# Patient Record
Sex: Male | Born: 1950 | Race: White | Hispanic: No | Marital: Married | Smoking: Former smoker
Health system: Southern US, Community
[De-identification: ages and names within clinical notes are randomized; demographics above are authoritative.]

## PROBLEM LIST (undated history)

## (undated) DIAGNOSIS — M1990 Unspecified osteoarthritis, unspecified site: Secondary | ICD-10-CM

## (undated) DIAGNOSIS — K760 Fatty (change of) liver, not elsewhere classified: Secondary | ICD-10-CM

## (undated) DIAGNOSIS — D3502 Benign neoplasm of left adrenal gland: Secondary | ICD-10-CM

## (undated) DIAGNOSIS — J439 Emphysema, unspecified: Secondary | ICD-10-CM

## (undated) DIAGNOSIS — K219 Gastro-esophageal reflux disease without esophagitis: Secondary | ICD-10-CM

## (undated) DIAGNOSIS — E119 Type 2 diabetes mellitus without complications: Secondary | ICD-10-CM

## (undated) DIAGNOSIS — I700 Atherosclerosis of aorta: Secondary | ICD-10-CM

## (undated) DIAGNOSIS — I499 Cardiac arrhythmia, unspecified: Secondary | ICD-10-CM

## (undated) DIAGNOSIS — I714 Abdominal aortic aneurysm, without rupture, unspecified: Secondary | ICD-10-CM

## (undated) DIAGNOSIS — Z87891 Personal history of nicotine dependence: Secondary | ICD-10-CM

## (undated) DIAGNOSIS — I10 Essential (primary) hypertension: Secondary | ICD-10-CM

## (undated) DIAGNOSIS — M170 Bilateral primary osteoarthritis of knee: Secondary | ICD-10-CM

## (undated) DIAGNOSIS — R911 Solitary pulmonary nodule: Secondary | ICD-10-CM

## (undated) HISTORY — DX: Essential (primary) hypertension: I10

## (undated) HISTORY — PX: SIGMOIDECTOMY: SHX176

## (undated) HISTORY — PX: TOOTH EXTRACTION: SUR596

## (undated) HISTORY — PX: FOOT SURGERY: SHX648

## (undated) HISTORY — DX: Type 2 diabetes mellitus without complications: E11.9

## (undated) HISTORY — PX: KNEE ARTHROSCOPY: SUR90

## (undated) HISTORY — DX: Solitary pulmonary nodule: R91.1

## (undated) HISTORY — DX: Abdominal aortic aneurysm, without rupture, unspecified: I71.40

## (undated) HISTORY — DX: Abdominal aortic aneurysm, without rupture: I71.4

## (undated) HISTORY — DX: Benign neoplasm of left adrenal gland: D35.02

---

## 2009-11-21 ENCOUNTER — Emergency Department: Payer: Self-pay | Admitting: Emergency Medicine

## 2014-01-28 DIAGNOSIS — E1159 Type 2 diabetes mellitus with other circulatory complications: Secondary | ICD-10-CM | POA: Insufficient documentation

## 2016-07-21 DIAGNOSIS — M1712 Unilateral primary osteoarthritis, left knee: Principal | ICD-10-CM | POA: Insufficient documentation

## 2016-07-21 DIAGNOSIS — M170 Bilateral primary osteoarthritis of knee: Secondary | ICD-10-CM | POA: Insufficient documentation

## 2016-08-03 ENCOUNTER — Other Ambulatory Visit: Payer: Self-pay

## 2016-08-31 ENCOUNTER — Ambulatory Visit: Payer: Self-pay

## 2016-12-14 ENCOUNTER — Other Ambulatory Visit: Payer: Self-pay | Admitting: Urology

## 2016-12-14 DIAGNOSIS — R319 Hematuria, unspecified: Secondary | ICD-10-CM

## 2016-12-22 ENCOUNTER — Ambulatory Visit: Payer: BLUE CROSS/BLUE SHIELD

## 2016-12-28 ENCOUNTER — Other Ambulatory Visit: Payer: Self-pay | Admitting: Urology

## 2016-12-28 DIAGNOSIS — R972 Elevated prostate specific antigen [PSA]: Secondary | ICD-10-CM

## 2016-12-29 DIAGNOSIS — R911 Solitary pulmonary nodule: Secondary | ICD-10-CM | POA: Insufficient documentation

## 2016-12-31 ENCOUNTER — Other Ambulatory Visit: Payer: Self-pay | Admitting: Internal Medicine

## 2016-12-31 DIAGNOSIS — D3502 Benign neoplasm of left adrenal gland: Secondary | ICD-10-CM | POA: Insufficient documentation

## 2016-12-31 DIAGNOSIS — I714 Abdominal aortic aneurysm, without rupture, unspecified: Secondary | ICD-10-CM

## 2016-12-31 DIAGNOSIS — R911 Solitary pulmonary nodule: Secondary | ICD-10-CM

## 2017-02-01 ENCOUNTER — Ambulatory Visit
Admission: RE | Admit: 2017-02-01 | Discharge: 2017-02-01 | Disposition: A | Discharge status: Home / Self Care | Payer: Medicare Other | Source: Ambulatory Visit | Attending: Internal Medicine | Admitting: Internal Medicine

## 2017-02-01 DIAGNOSIS — I2510 Atherosclerotic heart disease of native coronary artery without angina pectoris: Secondary | ICD-10-CM | POA: Diagnosis not present

## 2017-02-01 DIAGNOSIS — R911 Solitary pulmonary nodule: Secondary | ICD-10-CM | POA: Diagnosis not present

## 2017-02-01 DIAGNOSIS — I714 Abdominal aortic aneurysm, without rupture, unspecified: Secondary | ICD-10-CM

## 2017-02-01 DIAGNOSIS — J432 Centrilobular emphysema: Secondary | ICD-10-CM | POA: Insufficient documentation

## 2017-02-03 ENCOUNTER — Ambulatory Visit (INDEPENDENT_AMBULATORY_CARE_PROVIDER_SITE_OTHER): Payer: Medicare Other | Admitting: Vascular Surgery

## 2017-02-03 ENCOUNTER — Encounter (INDEPENDENT_AMBULATORY_CARE_PROVIDER_SITE_OTHER): Payer: Self-pay | Admitting: Vascular Surgery

## 2017-02-03 VITALS — BP 175/89 | HR 75 | Resp 17 | Ht 73.0 in | Wt 220.0 lb

## 2017-02-03 DIAGNOSIS — I714 Abdominal aortic aneurysm, without rupture, unspecified: Secondary | ICD-10-CM | POA: Insufficient documentation

## 2017-02-03 DIAGNOSIS — F17200 Nicotine dependence, unspecified, uncomplicated: Secondary | ICD-10-CM

## 2017-02-03 DIAGNOSIS — I10 Essential (primary) hypertension: Secondary | ICD-10-CM

## 2017-02-03 NOTE — Progress Notes (Signed)
Subjective:    Patient ID: Ernest Brown, male    DOB: 1950-09-11, 66 y.o.   MRN: [id number] Chief Complaint  Patient presents with  . AAA    ref August Luz as a new patient referred by Dr. Ouida Sills for evaluation of a abdominal aortic aneurysm.  The patient was undergoing a workup for hematuria and was found to have a 3.2cm abdominal aortic aneurysm on duplex completed on December 31, 2016.  The patient presents today asymptomatically.  The patient denies any abdominal pain, back pain or thrombosis to the bilateral lower extremities.  Patient denies any shortness of breath or chest pain. The patient denies any fever, nausea or vomiting.   Review of Systems  Constitutional: Negative.   HENT: Negative.   Eyes: Negative.   Respiratory: Negative.   Cardiovascular:       AAA  Gastrointestinal: Negative.   Endocrine: Negative.   Genitourinary: Negative.   Musculoskeletal: Negative.   Skin: Negative.   Allergic/Immunologic: Negative.   Neurological: Negative.   Hematological: Negative.   Psychiatric/Behavioral: Negative.       Objective:   Physical Exam  Constitutional: He is oriented to person, place, and time. He appears well-developed and well-nourished. No distress.  HENT:  Head: Normocephalic and atraumatic.  Eyes: Conjunctivae are normal. Pupils are equal, round, and reactive to light.  Neck: Normal range of motion.  Cardiovascular: Normal rate, regular rhythm, normal heart sounds and intact distal pulses.  Pulses:      Radial pulses are 2+ on the right side, and 2+ on the left side.       Dorsalis pedis pulses are 2+ on the right side, and 2+ on the left side.       Posterior tibial pulses are 2+ on the right side, and 2+ on the left side.  Pulmonary/Chest: Effort normal and breath sounds normal.  Abdominal: Soft. Bowel sounds are normal. He exhibits no distension. There is no tenderness. There is no rebound.  Musculoskeletal: Normal range of motion. He  exhibits no edema.  Neurological: He is alert and oriented to person, place, and time.  Skin: Skin is warm and dry. He is not diaphoretic.  Psychiatric: He has a normal mood and affect. His behavior is normal. Judgment and thought content normal.  Vitals reviewed.  BP (!) 175/89 (BP Location: Right Arm)   Pulse 75   Resp 17   Ht 6\' 1"  (1.854 m)   Wt 220 lb (99.8 kg)   BMI 29.03 kg/m   History reviewed. No pertinent past medical history.  Social History   Socioeconomic History  . Marital status: Married    Spouse name: Not on file  . Number of children: Not on file  . Years of education: Not on file  . Highest education level: Not on file  Social Needs  . Financial resource strain: Not on file  . Food insecurity - worry: Not on file  . Food insecurity - inability: Not on file  . Transportation needs - medical: Not on file  . Transportation needs - non-medical: Not on file  Occupational History  . Not on file  Tobacco Use  . Smoking status: Light Tobacco Smoker    Types: Cigarettes  . Smokeless tobacco: Never Used  Substance and Sexual Activity  . Alcohol use: No    Frequency: Never  . Drug use: No  . Sexual activity: Not on file  Other Topics Concern  . Not on file  Social History  Narrative  . Not on file   Past Surgical History:  Procedure Laterality Date  . FOOT SURGERY Bilateral   . KNEE ARTHROSCOPY    . SIGMOIDECTOMY    . TOOTH EXTRACTION     Family History  Problem Relation Age of Onset  . Congestive Heart Failure Mother   . Heart attack Father   . Prostate cancer Father    Allergies  Allergen Reactions  . Codeine Rash      Assessment & Plan:  Presents as a new patient referred by Dr. Ouida Sills for evaluation of a abdominal aortic aneurysm.  The patient was undergoing a workup for hematuria and was found to have a 3.2cm abdominal aortic aneurysm on duplex completed on December 31, 2016.  The patient presents today asymptomatically.  The patient  denies any abdominal pain, back pain or thrombosis to the bilateral lower extremities.  Patient denies any shortness of breath or chest pain. The patient denies any fever, nausea or vomiting.  1. AAA (abdominal aortic aneurysm) without rupture ([place])  Studies reviewed with patient. Patient found to have a 3.2 cm abdominal aortic aneurysm incidentally during a workup for hematuria The patient to follow up in 6 months with an aortic duplex. If the AAA is stable we can move follow up out yearly The patient has an asymptomatic abdominal aortic aneurysm that is less than 4 cm in maximal diameter.  I have reviewed the natural history of abdominal aortic aneurysm and the small risk of rupture for aneurysm less than 5 cm in size.  However, as these small aneurysms tend to enlarge over time, continued surveillance with ultrasound or CT scan is mandatory.  The patient's blood pressure is being adequately controlled however I have reviewed the importance of hypertension and lipid control and the importance of continuing his abstinence from tobacco.  The patient is also encouraged to exercise a minimum of 30 minutes 4 times a week.  Should the patient develop new onset abdominal or back pain or signs of peripheral embolization they are instructed to seek medical attention immediately and to alert the physician providing care that they have an aneurysm.  The patient voices their understanding.  - VAS [place] AAA DUPLEX; Future  2. Essential hypertension - Stable Encouraged good control as its slows the progression of atherosclerotic disease  3. Tobacco use disorder - Stable We had a discussion for approximately 10 minutes regarding the absolute need for smoking cessation due to the deleterious nature of tobacco on the vascular system. We discussed the tobacco use would diminish patency of any intervention, and likely significantly worsen progressio of disease. We discussed multiple agents for quitting including  replacement therapy or medications to reduce cravings such as Chantix. The patient voices their understanding of the importance of smoking cessation.  Current Outpatient Medications on File Prior to Visit  Medication Sig Dispense Refill  . ibuprofen (ADVIL,MOTRIN) 200 MG tablet Take 200 mg by mouth every 6 (six) hours as needed.    . Multiple Vitamin (MULTI-VITAMINS) TABS Take by mouth.    . vitamin B-12 (CYANOCOBALAMIN) 1000 MCG tablet Take by mouth.     No current facility-administered medications on file prior to visit.     There are no Patient Instructions on file for this visit. No Follow-up on file.   Jaaron Oleson A Dezarai Prew, PA-C

## 2017-02-04 ENCOUNTER — Other Ambulatory Visit: Payer: Self-pay | Admitting: Internal Medicine

## 2017-02-04 DIAGNOSIS — R911 Solitary pulmonary nodule: Secondary | ICD-10-CM

## 2017-02-08 ENCOUNTER — Ambulatory Visit (HOSPITAL_COMMUNITY)
Admission: RE | Admit: 2017-02-08 | Discharge: 2017-02-08 | Disposition: A | Discharge status: Home / Self Care | Payer: Medicare Other | Source: Ambulatory Visit | Attending: Urology | Admitting: Urology

## 2017-02-08 DIAGNOSIS — R972 Elevated prostate specific antigen [PSA]: Secondary | ICD-10-CM

## 2017-03-10 ENCOUNTER — Other Ambulatory Visit (HOSPITAL_COMMUNITY): Payer: Self-pay | Admitting: Urology

## 2017-03-10 ENCOUNTER — Ambulatory Visit (HOSPITAL_COMMUNITY)
Admission: RE | Admit: 2017-03-10 | Discharge: 2017-03-10 | Disposition: A | Discharge status: Home / Self Care | Payer: Medicare Other | Source: Ambulatory Visit | Attending: Urology | Admitting: Urology

## 2017-03-10 ENCOUNTER — Other Ambulatory Visit: Payer: Self-pay | Admitting: Urology

## 2017-03-10 DIAGNOSIS — I700 Atherosclerosis of aorta: Secondary | ICD-10-CM | POA: Diagnosis not present

## 2017-03-10 DIAGNOSIS — N400 Enlarged prostate without lower urinary tract symptoms: Secondary | ICD-10-CM | POA: Insufficient documentation

## 2017-03-10 DIAGNOSIS — R972 Elevated prostate specific antigen [PSA]: Secondary | ICD-10-CM | POA: Insufficient documentation

## 2017-03-10 LAB — POCT I-STAT CREATININE: Creatinine, Ser: 0.9 mg/dL (ref 0.61–1.24)

## 2017-03-10 MED ORDER — GADOBENATE DIMEGLUMINE 529 MG/ML IV SOLN
20.0000 mL | Freq: Once | INTRAVENOUS | Status: AC | PRN
  Administered 2017-03-10: 20 mL via INTRAVENOUS

## 2017-07-13 ENCOUNTER — Telehealth (INDEPENDENT_AMBULATORY_CARE_PROVIDER_SITE_OTHER): Payer: Self-pay | Admitting: Vascular Surgery

## 2017-07-13 NOTE — Telephone Encounter (Signed)
LVM for pt to call and r/s appt due to US tech not available.  °

## 2017-08-04 ENCOUNTER — Other Ambulatory Visit (INDEPENDENT_AMBULATORY_CARE_PROVIDER_SITE_OTHER): Payer: Medicare Other

## 2017-08-04 ENCOUNTER — Ambulatory Visit (INDEPENDENT_AMBULATORY_CARE_PROVIDER_SITE_OTHER): Payer: Medicare Other | Admitting: Vascular Surgery

## 2017-08-09 ENCOUNTER — Other Ambulatory Visit (INDEPENDENT_AMBULATORY_CARE_PROVIDER_SITE_OTHER): Payer: Self-pay

## 2017-08-09 DIAGNOSIS — I714 Abdominal aortic aneurysm, without rupture, unspecified: Secondary | ICD-10-CM

## 2017-08-16 ENCOUNTER — Ambulatory Visit
Admission: RE | Admit: 2017-08-16 | Discharge: 2017-08-16 | Disposition: A | Discharge status: Home / Self Care | Payer: Medicare Other | Source: Ambulatory Visit | Attending: Vascular Surgery | Admitting: Vascular Surgery

## 2017-08-16 DIAGNOSIS — I700 Atherosclerosis of aorta: Secondary | ICD-10-CM | POA: Diagnosis not present

## 2017-08-16 DIAGNOSIS — I714 Abdominal aortic aneurysm, without rupture, unspecified: Secondary | ICD-10-CM

## 2017-08-22 ENCOUNTER — Ambulatory Visit (INDEPENDENT_AMBULATORY_CARE_PROVIDER_SITE_OTHER): Payer: Medicare Other | Admitting: Vascular Surgery

## 2017-08-22 ENCOUNTER — Other Ambulatory Visit (INDEPENDENT_AMBULATORY_CARE_PROVIDER_SITE_OTHER): Payer: Medicare Other

## 2017-08-22 ENCOUNTER — Encounter (INDEPENDENT_AMBULATORY_CARE_PROVIDER_SITE_OTHER): Payer: Self-pay | Admitting: Vascular Surgery

## 2017-08-22 VITALS — BP 144/83 | HR 79 | Resp 13 | Ht 73.0 in | Wt 215.0 lb

## 2017-08-22 DIAGNOSIS — I10 Essential (primary) hypertension: Secondary | ICD-10-CM | POA: Diagnosis not present

## 2017-08-22 DIAGNOSIS — F17200 Nicotine dependence, unspecified, uncomplicated: Secondary | ICD-10-CM

## 2017-08-22 DIAGNOSIS — I714 Abdominal aortic aneurysm, without rupture, unspecified: Secondary | ICD-10-CM

## 2017-08-22 NOTE — Progress Notes (Signed)
Subjective:    Patient ID: Ernest Brown, male    DOB: October 30, 1950, 67 y.o.   MRN: [id number] Chief Complaint  Patient presents with  . Follow-up    6 month AAA   The patient presents for six month AAA follow up. He underwent an aortic duplex which was notable for an abdominal aortic aneurysm measuring 3.0cm AP x 3.0cm transverse (previous 3.2cm). He denies any symptoms such as back pain, pulsatile abdominal masses or thrombosis in his extremities. His hypertension is adequately controlled.  Patient denies any fever, nausea vomiting.  Review of Systems  Constitutional: Negative.   HENT: Negative.   Eyes: Negative.   Respiratory: Negative.   Cardiovascular:       AAA  Gastrointestinal: Negative.   Endocrine: Negative.   Genitourinary: Negative.   Musculoskeletal: Negative.   Skin: Negative.   Allergic/Immunologic: Negative.   Neurological: Negative.   Hematological: Negative.   Psychiatric/Behavioral: Negative.       Objective:   Physical Exam  Constitutional: He is oriented to person, place, and time. He appears well-developed and well-nourished. No distress.  HENT:  Head: Normocephalic and atraumatic.  Right Ear: External ear normal.  Left Ear: External ear normal.  Eyes: Pupils are equal, round, and reactive to light. Conjunctivae and EOM are normal.  Neck: Normal range of motion.  Cardiovascular: Normal rate, regular rhythm, normal heart sounds and intact distal pulses.  Pulmonary/Chest: Effort normal and breath sounds normal.  Abdominal: Soft. Bowel sounds are normal.  Musculoskeletal: Normal range of motion. He exhibits no edema.  Neurological: He is alert and oriented to person, place, and time.  Skin: Skin is warm and dry. He is not diaphoretic.  Psychiatric: He has a normal mood and affect. His behavior is normal. Judgment and thought content normal.  Vitals reviewed.  BP (!) 144/83 (BP Location: Right Arm, Patient Position: Sitting)   Pulse 79   Resp 13    Ht 6\' 1"  (1.854 m)   Wt 215 lb (97.5 kg)   BMI 28.37 kg/m   Past Medical History:  Diagnosis Date  . AAA (abdominal aortic aneurysm) ([place])   . Hypertension    Social History   Socioeconomic History  . Marital status: Married    Spouse name: Not on file  . Number of children: Not on file  . Years of education: Not on file  . Highest education level: Not on file  Occupational History  . Not on file  Social Needs  . Financial resource strain: Not on file  . Food insecurity:    Worry: Not on file    Inability: Not on file  . Transportation needs:    Medical: Not on file    Non-medical: Not on file  Tobacco Use  . Smoking status: Light Tobacco Smoker    Types: Cigarettes  . Smokeless tobacco: Never Used  Substance and Sexual Activity  . Alcohol use: No    Frequency: Never  . Drug use: No  . Sexual activity: Not on file  Lifestyle  . Physical activity:    Days per week: Not on file    Minutes per session: Not on file  . Stress: Not on file  Relationships  . Social connections:    Talks on phone: Not on file    Gets together: Not on file    Attends religious service: Not on file    Active member of club or organization: Not on file    Attends meetings of clubs or  organizations: Not on file    Relationship status: Not on file  . Intimate partner violence:    Fear of current or ex partner: Not on file    Emotionally abused: Not on file    Physically abused: Not on file    Forced sexual activity: Not on file  Other Topics Concern  . Not on file  Social History Narrative  . Not on file   Past Surgical History:  Procedure Laterality Date  . FOOT SURGERY Bilateral   . KNEE ARTHROSCOPY    . SIGMOIDECTOMY    . TOOTH EXTRACTION     Family History  Problem Relation Age of Onset  . Congestive Heart Failure Mother   . Heart attack Father   . Prostate cancer Father    Allergies  Allergen Reactions  . Codeine Rash      Assessment & Plan:  The patient presents  for six month AAA follow up. He underwent an aortic duplex which was notable for an abdominal aortic aneurysm measuring 3.0cm AP x 3.0cm transverse (previous 3.2cm). He denies any symptoms such as back pain, pulsatile abdominal masses or thrombosis in his extremities. His hypertension is adequately controlled.  Patient denies any fever, nausea vomiting.  1. AAA (abdominal aortic aneurysm) without rupture ([facility]) - Stable Studies reviewed with patient. Duplex stable, physical exam unremarkable. No surgery or intervention at this time. The patient to follow up in one year with an aortic duplex. The patient has an asymptomatic abdominal aortic aneurysm that is less than 4 cm in maximal diameter.  I have reviewed the natural history of abdominal aortic aneurysm and the small risk of rupture for aneurysm less than 5 cm in size.  However, as these small aneurysms tend to enlarge over time, continued surveillance with ultrasound or CT scan is mandatory.  The patient's blood pressure is being adequately controlled however I have reviewed the importance of hypertension and lipid control and the importance of continuing his abstinence from tobacco.  The patient is also encouraged to exercise a minimum of 30 minutes 4 times a week.  Should the patient develop new onset abdominal or back pain or signs of peripheral embolization they are instructed to seek medical attention immediately and to alert the physician providing care that they have an aneurysm.  The patient voices their understanding.  - VAS [place] AAA DUPLEX; Future  2. Essential hypertension - Stable Encouraged good control as its slows the progression of atherosclerotic disease  3. Tobacco use disorder - Stable We had a discussion for approximately 10 minutes regarding the absolute need for smoking cessation due to the deleterious nature of tobacco on the vascular system. We discussed the tobacco use would diminish patency of any intervention, and likely  significantly worsen progressio of disease. We discussed multiple agents for quitting including replacement therapy or medications to reduce cravings such as Chantix. The patient voices their understanding of the importance of smoking cessation.  Current Outpatient Medications on File Prior to Visit  Medication Sig Dispense Refill  . amLODipine (NORVASC) 5 MG tablet TAKE 1 TABLET(5 MG) BY MOUTH EVERY DAY    . finasteride (PROSCAR) 5 MG tablet TK 1 T PO D  3  . fluticasone (FLONASE) 50 MCG/ACT nasal spray Place into the nose.    . ibuprofen (ADVIL,MOTRIN) 200 MG tablet Take 200 mg by mouth every 6 (six) hours as needed.    . meloxicam (MOBIC) 15 MG tablet     . Multiple Vitamin (MULTI-VITAMINS) TABS Take  by mouth.    . triamcinolone cream (KENALOG) 0.1 %   1  . vitamin B-12 (CYANOCOBALAMIN) 1000 MCG tablet Take by mouth.     No current facility-administered medications on file prior to visit.    There are no Patient Instructions on file for this visit. No follow-ups on file.  KIMBERLY A STEGMAYER, PA-C

## 2017-09-26 DIAGNOSIS — J432 Centrilobular emphysema: Secondary | ICD-10-CM | POA: Insufficient documentation

## 2017-10-18 ENCOUNTER — Other Ambulatory Visit: Payer: Self-pay | Admitting: "Endocrinology

## 2017-10-18 DIAGNOSIS — D3502 Benign neoplasm of left adrenal gland: Secondary | ICD-10-CM

## 2017-11-01 ENCOUNTER — Other Ambulatory Visit: Payer: Self-pay | Admitting: Internal Medicine

## 2017-11-01 ENCOUNTER — Other Ambulatory Visit: Payer: Self-pay | Admitting: "Endocrinology

## 2017-11-01 DIAGNOSIS — D3502 Benign neoplasm of left adrenal gland: Secondary | ICD-10-CM

## 2017-11-02 ENCOUNTER — Ambulatory Visit: Admission: RE | Admit: 2017-11-02 | Payer: Medicare Other | Source: Ambulatory Visit

## 2017-11-09 ENCOUNTER — Ambulatory Visit
Admission: RE | Admit: 2017-11-09 | Discharge: 2017-11-09 | Disposition: A | Discharge status: Home / Self Care | Payer: Medicare Other | Source: Ambulatory Visit | Attending: "Endocrinology | Admitting: "Endocrinology

## 2017-11-09 DIAGNOSIS — K760 Fatty (change of) liver, not elsewhere classified: Secondary | ICD-10-CM | POA: Diagnosis not present

## 2017-11-09 DIAGNOSIS — I700 Atherosclerosis of aorta: Secondary | ICD-10-CM | POA: Insufficient documentation

## 2017-11-09 DIAGNOSIS — I77811 Abdominal aortic ectasia: Secondary | ICD-10-CM | POA: Diagnosis not present

## 2017-11-09 DIAGNOSIS — J439 Emphysema, unspecified: Secondary | ICD-10-CM | POA: Insufficient documentation

## 2017-11-09 DIAGNOSIS — D3502 Benign neoplasm of left adrenal gland: Secondary | ICD-10-CM

## 2018-02-01 ENCOUNTER — Ambulatory Visit
Admission: RE | Admit: 2018-02-01 | Discharge: 2018-02-01 | Disposition: A | Discharge status: Home / Self Care | Payer: Medicare Other | Source: Ambulatory Visit | Attending: Internal Medicine | Admitting: Internal Medicine

## 2018-02-01 DIAGNOSIS — R911 Solitary pulmonary nodule: Secondary | ICD-10-CM

## 2018-10-26 DIAGNOSIS — Z85828 Personal history of other malignant neoplasm of skin: Secondary | ICD-10-CM | POA: Insufficient documentation

## 2019-04-14 IMAGING — CT CT CHEST W/O CM
2 of 4 series · 15 of 36 positions shown, 18 images · non-contrast
Comparison: CT scan of February 01, 2017.

CLINICAL DATA: Lung nodule.

EXAM:
CT CHEST WITHOUT CONTRAST
TECHNIQUE: Multidetector CT imaging of the chest was performed following the
standard protocol without IV contrast.

[Series 2: chest · axial · 0.75mm/px · z∈[-1262,-962]mm · 12 of 178 slices shown, 15 images (1 of 2)]
[im 14/178  mediastinal]
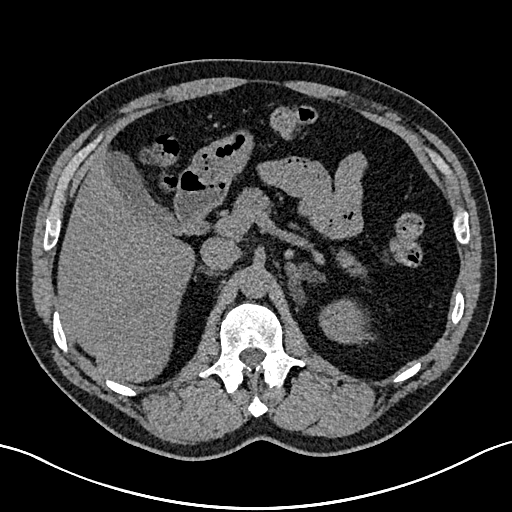
[im 14/178  lung]
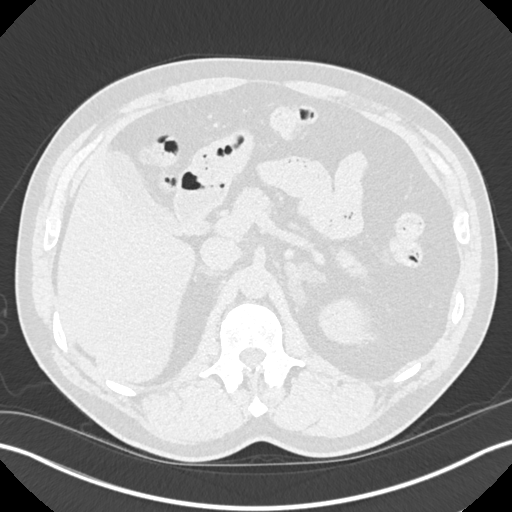
[im 28/178  lung]
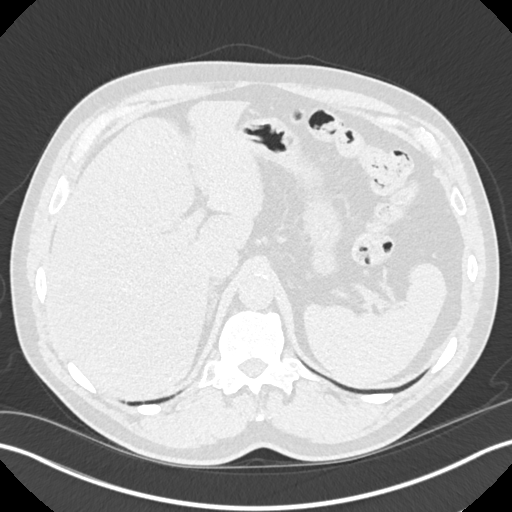
[im 41/178  lung]
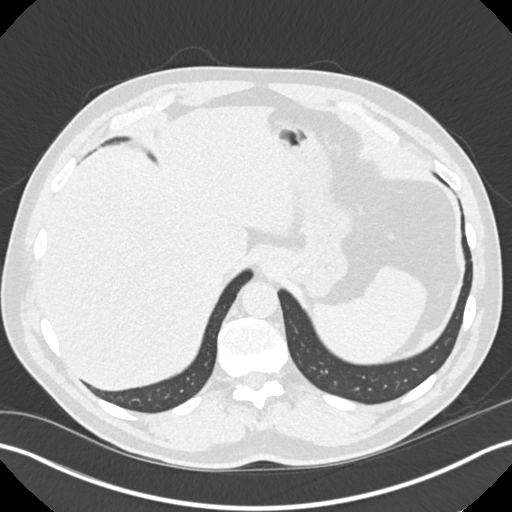
[im 55/178  lung]
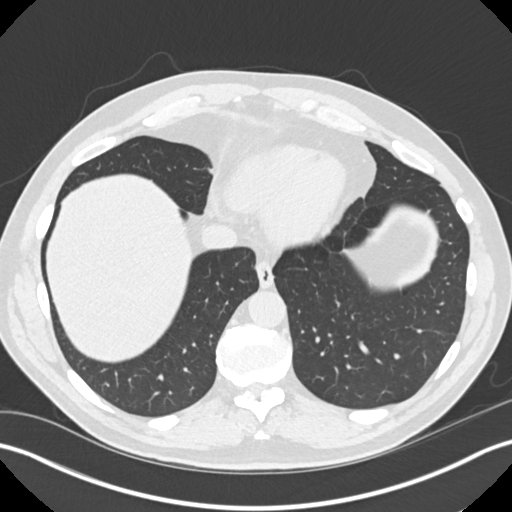
[im 69/178  mediastinal]
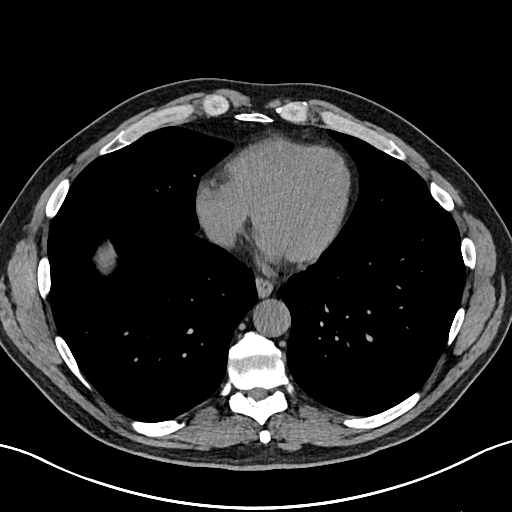
[im 69/178  lung]
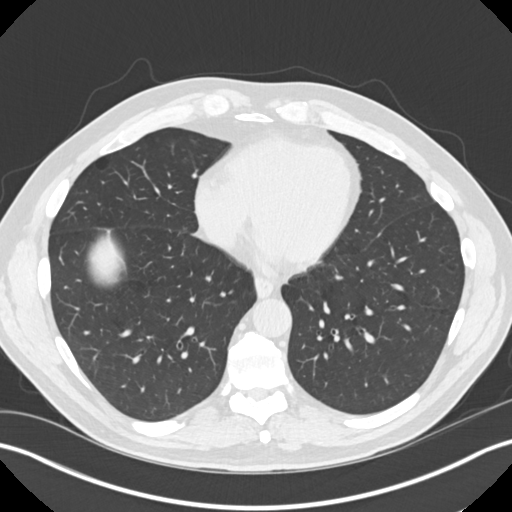
[im 82/178  lung]
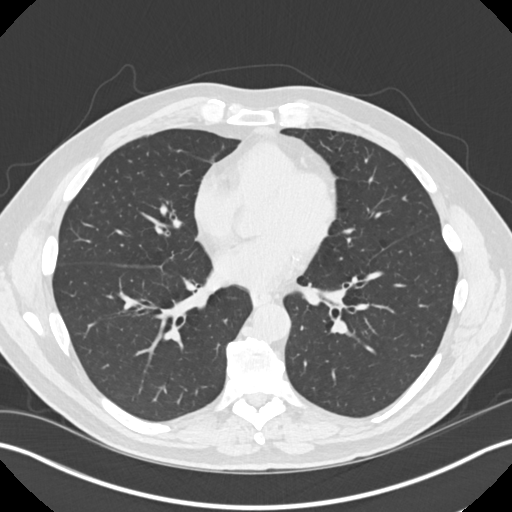
[im 96/178  lung]
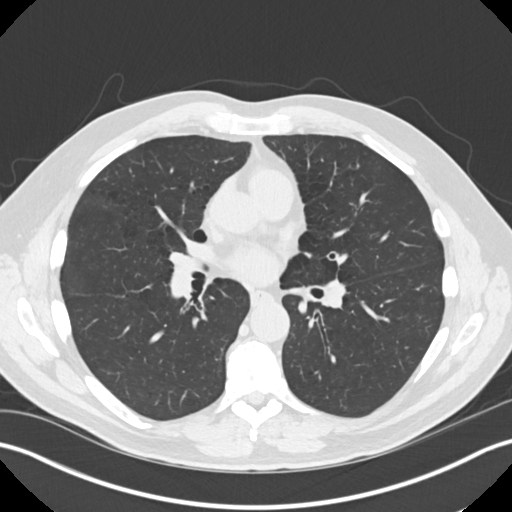
[im 109/178  lung]
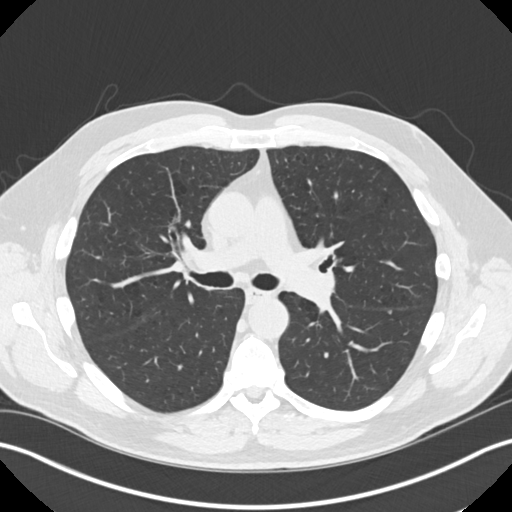
[im 123/178  mediastinal]
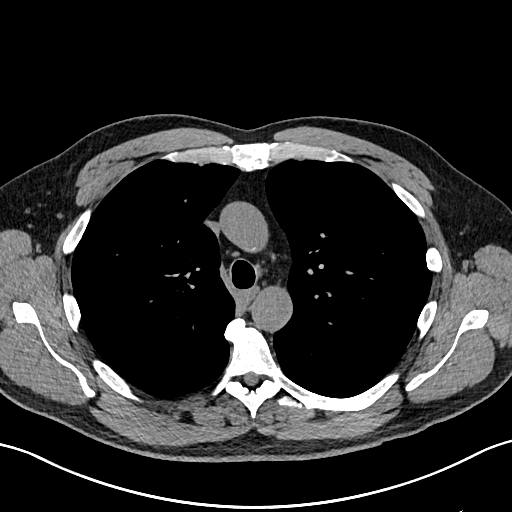
[im 123/178  lung]
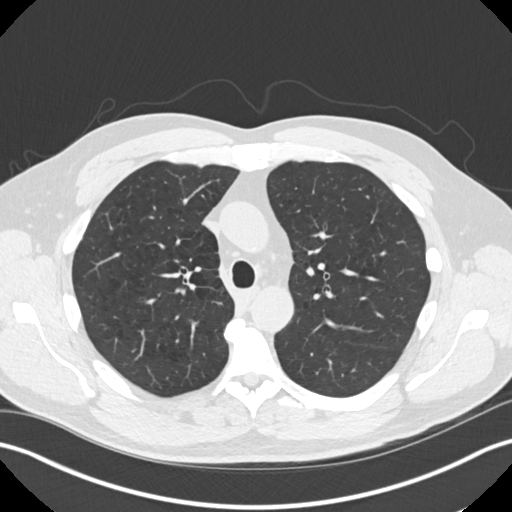
[im 137/178  lung]
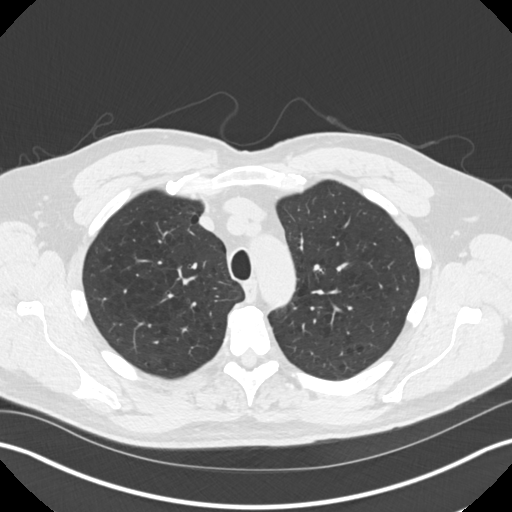
[im 150/178  lung]
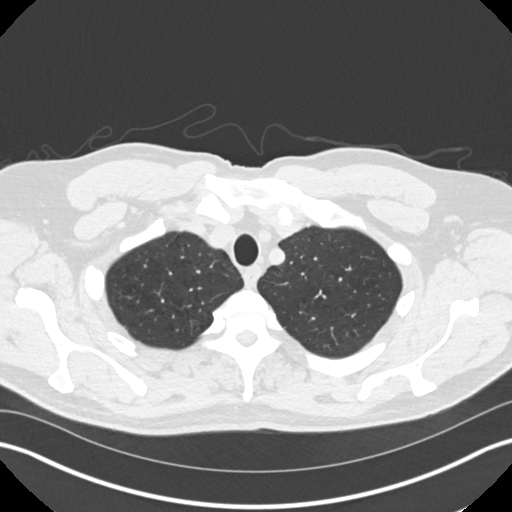
[im 164/178  lung]
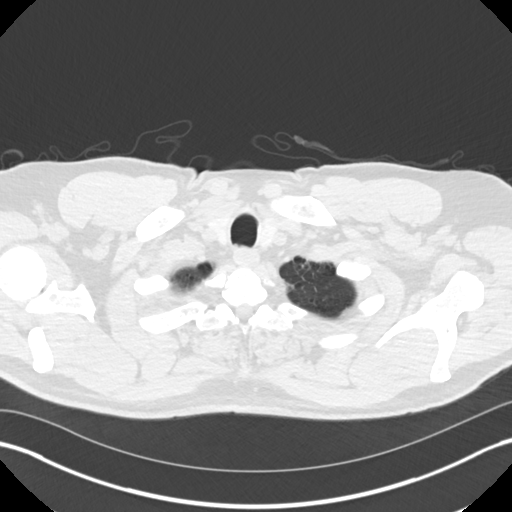

[Series 5: chest · coronal · 0.70mm/px · 3 of 159 slices shown (2 of 2)]
[im 32/159  lung]
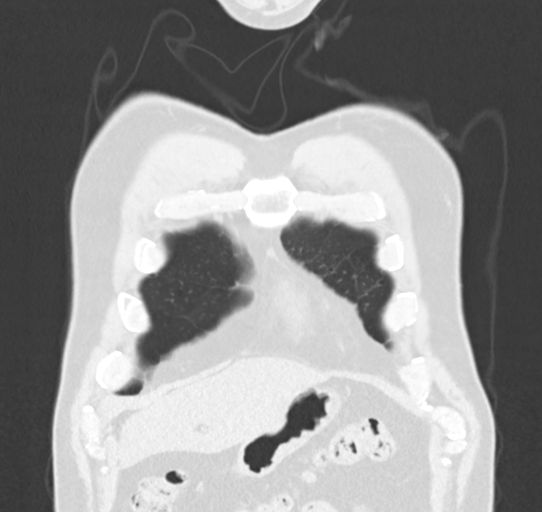
[im 64/159  lung]
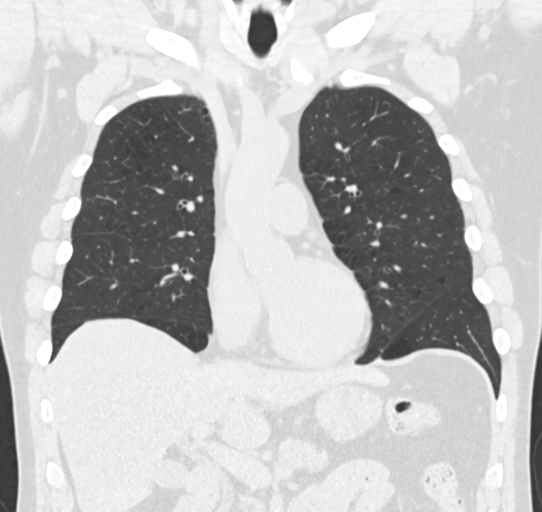
[im 95/159  lung]
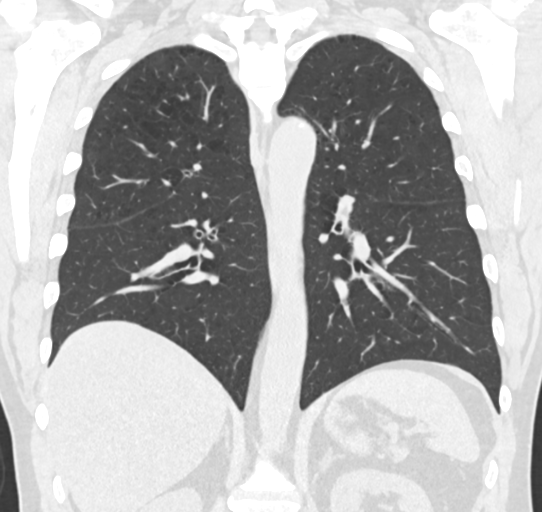

[15 of 36 positions shown; findings below may reference images not displayed]

FINDINGS: Cardiovascular: Atherosclerosis of thoracic aorta is noted without
aneurysm formation. Normal cardiac size. No pericardial effusion is
noted.

Mediastinum/Nodes: No enlarged mediastinal or axillary lymph nodes.
Thyroid gland, trachea, and esophagus demonstrate no significant
findings.

Lungs/Pleura: No pneumothorax or pleural effusion is noted.
Emphysematous disease is noted in the upper lobes bilaterally.
Stable 5 mm subpleural nodule seen in right posterior costophrenic
sulcus best seen on image number 146 of series 3.

Upper Abdomen: Stable left adrenal hyperplasia is noted. Probable
fatty infiltration of the liver.

Musculoskeletal: No chest wall mass or suspicious bone lesions
identified.
IMPRESSION: Stable 5 mm subpleural nodule seen in right posterior costophrenic
sulcus. This can be considered benign at this point with no further
follow-up required.

Probable fatty infiltration of the liver.

Aortic Atherosclerosis (8U8YP-B0Z.Z) and Emphysema (8U8YP-KEI.V).

## 2019-05-07 ENCOUNTER — Encounter: Payer: Medicare Other | Attending: Internal Medicine | Admitting: *Deleted

## 2019-05-07 ENCOUNTER — Encounter: Payer: Self-pay | Admitting: *Deleted

## 2019-05-07 ENCOUNTER — Other Ambulatory Visit: Payer: Self-pay

## 2019-05-07 VITALS — BP 126/80 | Ht 73.0 in | Wt 205.3 lb

## 2019-05-07 DIAGNOSIS — E1151 Type 2 diabetes mellitus with diabetic peripheral angiopathy without gangrene: Secondary | ICD-10-CM | POA: Insufficient documentation

## 2019-05-07 DIAGNOSIS — Z713 Dietary counseling and surveillance: Secondary | ICD-10-CM | POA: Insufficient documentation

## 2019-05-07 DIAGNOSIS — E119 Type 2 diabetes mellitus without complications: Secondary | ICD-10-CM

## 2019-05-07 NOTE — Patient Instructions (Addendum)
Check blood sugars 2 x day before breakfast and 2 hrs after supper every day Bring blood sugar records to the next appointment  Exercise:  Begin walking  for  15   minutes  3  days a week and gradually increase to 30 minutes 5 x week  Eat 3 meals day,  2  snacks a day Space meals 4-6 hours apart Allow 2-3 hours between meals and snacks Limit desserts/sweets Complete 3 Day Food Record and bring to next appt  Return for appointment on: Tuesday June 05, 2019 at 3:30 pm with Freda Munro (nurse)

## 2019-05-08 NOTE — Progress Notes (Signed)
Diabetes Self-Management Education  Visit Type: First/Initial  Appt. Start Time: 1535 Appt. End Time: [id number]  05/07/2019  Mr. Ernest Brown, identified by name and date of birth, is a 69 y.o. male with a diagnosis of Diabetes: Type 2.   ASSESSMENT  Blood pressure 126/80, height 6\' 1"  (1.854 m), weight 205 lb 4.8 oz (93.1 kg). Body mass index is 27.09 kg/m.  Diabetes Self-Management Education - 05/07/19 1705      Visit Information   Visit Type  First/Initial      Initial Visit   Diabetes Type  Type 2    Are you currently following a meal plan?  Yes    What type of meal plan do you follow?  "trying to control sugar"    Are you taking your medications as prescribed?  No   Not taking Glimepiride   Date Diagnosed  Jan 23, 2019 per patient but A1C from 2018 was 6.5 %      Health Coping   How would you rate your overall health?  Good      Psychosocial Assessment   Patient Belief/Attitude about Diabetes  Other (comment)   "stupid"   Self-care barriers  None    Self-management support  Family    Other persons present  Spouse/SO    Patient Concerns  Nutrition/Meal planning;Glycemic Control;Medication;Monitoring    Special Needs  None    Preferred Learning Style  Hands on    Learning Readiness  Change in progress    How often do you need to have someone help you when you read instructions, pamphlets, or other written materials from your doctor or pharmacy?  1 - Never    What is the last grade level you completed in school?  12th      Pre-Education Assessment   Patient understands the diabetes disease and treatment process.  Needs Instruction    Patient understands incorporating nutritional management into lifestyle.  Needs Instruction    Patient undertands incorporating physical activity into lifestyle.  Needs Instruction    Patient understands using medications safely.  Needs Instruction    Patient understands monitoring blood glucose, interpreting and using results  Needs Review     Patient understands prevention, detection, and treatment of acute complications.  Needs Instruction    Patient understands prevention, detection, and treatment of chronic complications.  Needs Instruction    Patient understands how to develop strategies to address psychosocial issues.  Needs Instruction    Patient understands how to develop strategies to promote health/change behavior.  Needs Instruction      Complications   Last HgB A1C per patient/outside source  10 %   01/15/2019   How often do you check your blood sugar?  1-2 times/day    Fasting Blood glucose range (mg/dL)  70-129;130-179   FBG's range from 115-143 mg/dL   Postprandial Blood glucose range (mg/dL)  70-129;130-179   pp's range from 85-141 mg/dL   Have you had a dilated eye exam in the past 12 months?  Yes    Have you had a dental exam in the past 12 months?  Yes    Are you checking your feet?  Yes    How many days per week are you checking your feet?  1      Dietary Intake   Breakfast  egg, sausage    Lunch  ham and cheese sandwich on whole wheat bread; burger, subs, chicken wings    Snack (afternoon)  nuts, cheese, cookies  Dinner  chicken, beef, pork, fish with bread, peas, beans, corn, rice, pasta, salads with lettuce, tomatoes, cuccumbers, onions, broccoli, carrots, celery    Snack (evening)  cookies and ice cream    Beverage(s)  water, diet soda      Exercise   Exercise Type  ADL's      Patient Education   Previous Diabetes Education  No    Disease state   Definition of diabetes, type 1 and 2, and the diagnosis of diabetes;Factors that contribute to the development of diabetes    Nutrition management   Role of diet in the treatment of diabetes and the relationship between the three main macronutrients and blood glucose level;Food label reading, portion sizes and measuring food.;Reviewed blood glucose goals for pre and post meals and how to evaluate the patients' food intake on their blood glucose level.     Physical activity and exercise   Role of exercise on diabetes management, blood pressure control and cardiac health.    Medications  Reviewed patients medication for diabetes, action, purpose, timing of dose and side effects.    Monitoring  Purpose and frequency of SMBG.;Taught/discussed recording of test results and interpretation of SMBG.;Identified appropriate SMBG and/or A1C goals.    Chronic complications  Relationship between chronic complications and blood glucose control    Psychosocial adjustment  Identified and addressed patients feelings and concerns about diabetes      Individualized Goals (developed by patient)   Reducing Risk  Other (comment)   improve blood sugars, decrease medications, prevent diabetes complications, become more fit     Outcomes   Expected Outcomes  Demonstrated interest in learning. Expect positive outcomes    Future DMSE  4-6 wks       Individualized Plan for Diabetes Self-Management Training:   Learning Objective:  Patient will have a greater understanding of diabetes self-management. Patient education plan is to attend individual and/or group sessions per assessed needs and concerns.   Plan:   Patient Instructions  Check blood sugars 2 x day before breakfast and 2 hrs after supper every day Bring blood sugar records to the next appointment Exercise:  Begin walking  for  15   minutes  3  days a week and gradually increase to 30 minutes 5 x week Eat 3 meals day,  2  snacks a day Space meals 4-6 hours apart Allow 2-3 hours between meals and snacks Limit desserts/sweets Complete 3 Day Food Record and bring to next appt Return for appointment on: Tuesday June 05, 2019 at 3:30 pm with Ernest Brown (nurse)  Expected Outcomes:  Demonstrated interest in learning. Expect positive outcomes  Education material provided:  General Meal Planning Guidelines Simple Meal Plan 3 Day Food Record  If problems or questions, patient to contact team via:  Ernest Drilling, RN, CCM, CDE [phone]  Future DSME appointment: 4-6 wks  The patient doesn't want to return for Diabetes classes but agreed to attend the 2 Hour Refresher Program. His last appointment is scheduled with this nurse on June 05, 2019.

## 2019-06-04 ENCOUNTER — Telehealth: Payer: Self-pay | Admitting: *Deleted

## 2019-06-04 NOTE — Telephone Encounter (Signed)
Received call from patient's wife. She reports that he will be out of town for tomorrow's appointment. He will call back to reschedule.

## 2019-06-05 ENCOUNTER — Ambulatory Visit: Payer: Medicare Other | Admitting: *Deleted

## 2019-06-28 ENCOUNTER — Encounter: Payer: Self-pay | Admitting: *Deleted

## 2020-07-25 ENCOUNTER — Other Ambulatory Visit (HOSPITAL_COMMUNITY): Payer: Self-pay | Admitting: Internal Medicine

## 2020-07-25 ENCOUNTER — Other Ambulatory Visit: Payer: Self-pay | Admitting: Internal Medicine

## 2020-07-25 DIAGNOSIS — R9389 Abnormal findings on diagnostic imaging of other specified body structures: Secondary | ICD-10-CM

## 2020-08-29 ENCOUNTER — Ambulatory Visit
Admission: RE | Admit: 2020-08-29 | Discharge: 2020-08-29 | Disposition: A | Discharge status: Home / Self Care | Payer: Medicare Other | Source: Ambulatory Visit | Attending: Internal Medicine | Admitting: Internal Medicine

## 2020-08-29 ENCOUNTER — Other Ambulatory Visit: Payer: Self-pay

## 2020-08-29 DIAGNOSIS — R9389 Abnormal findings on diagnostic imaging of other specified body structures: Secondary | ICD-10-CM | POA: Insufficient documentation

## 2021-11-09 IMAGING — US US AORTA
1 series · 14 of 18 positions shown · non-contrast
Comparison: Abdominal aortic ultrasound on 08/16/2017 and CT of the
abdomen on 11/09/2017.

CLINICAL DATA: History of abdominal aortic aneurysm.

EXAM:
ULTRASOUND OF ABDOMINAL AORTA
TECHNIQUE: Ultrasound examination of the abdominal aorta and proximal common
iliac arteries was performed to evaluate for aneurysm. Additional
color and Doppler images of the distal aorta were obtained to
document patency.

[Series 1: us aorta · 0.26mm/px · 14 of 18 slices shown]
[im 1/18]
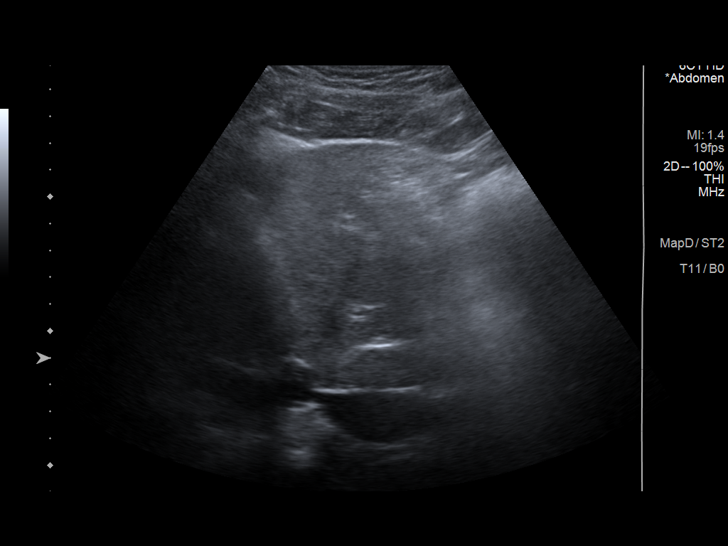
[im 2/18]
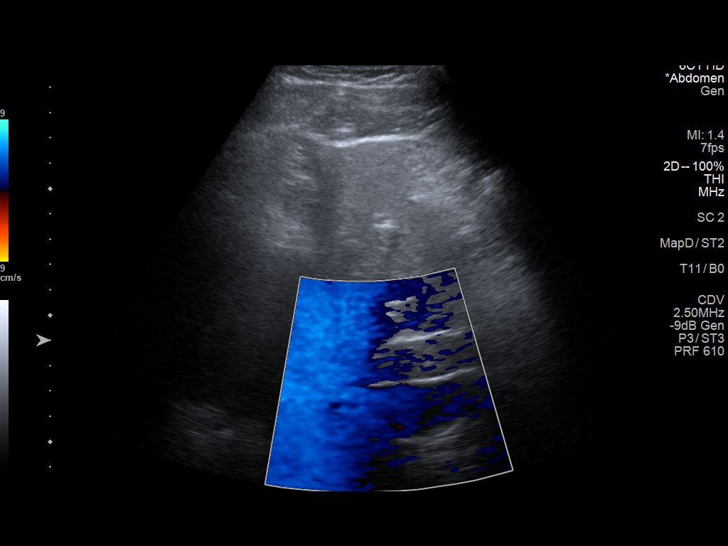
[im 4/18]
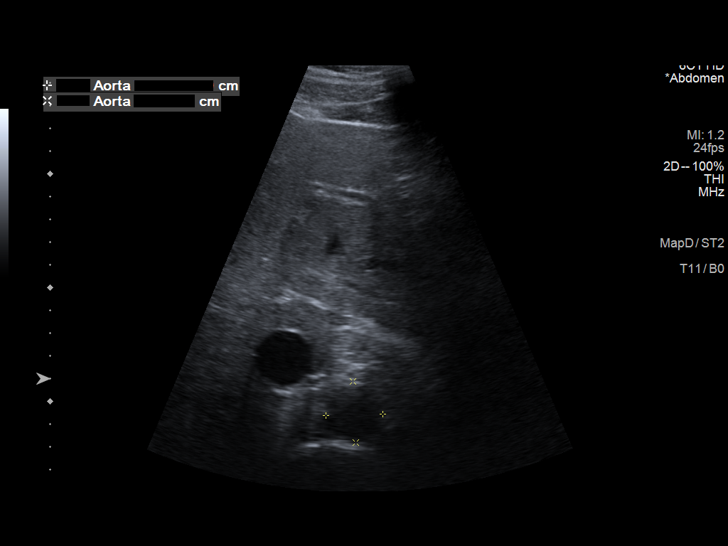
[im 5/18]
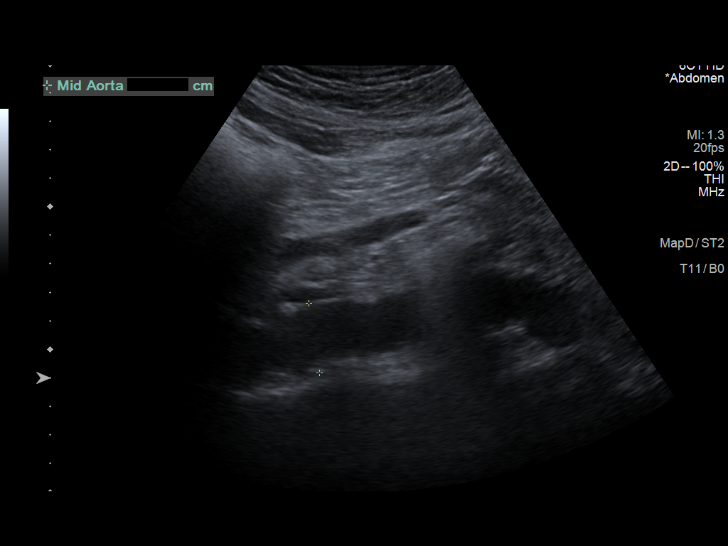
[im 6/18]
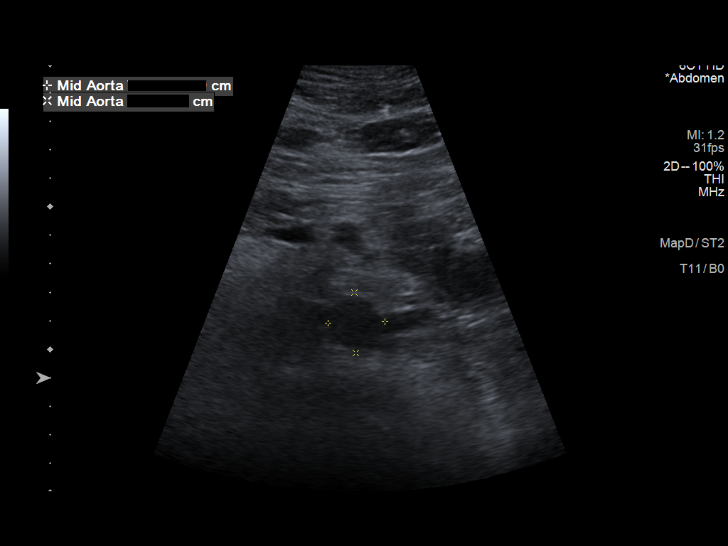
[im 8/18]
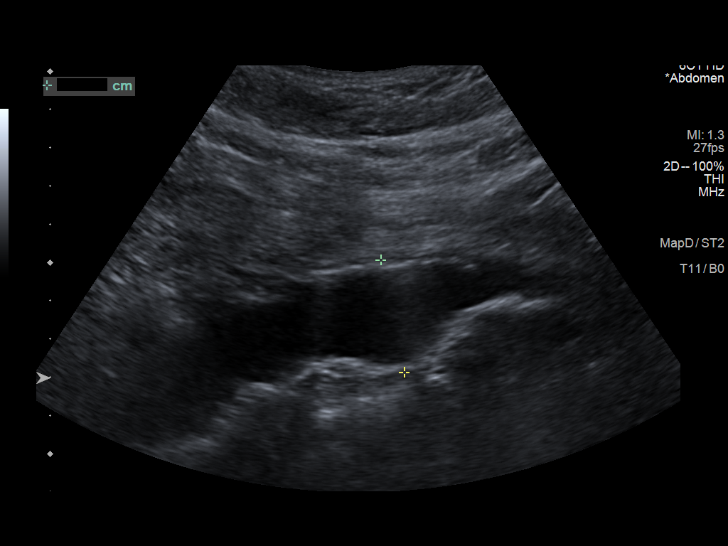
[im 9/18]
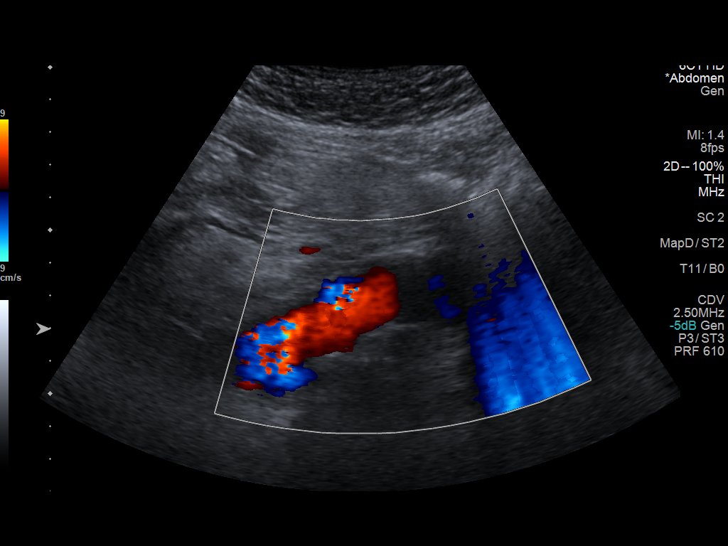
[im 10/18]
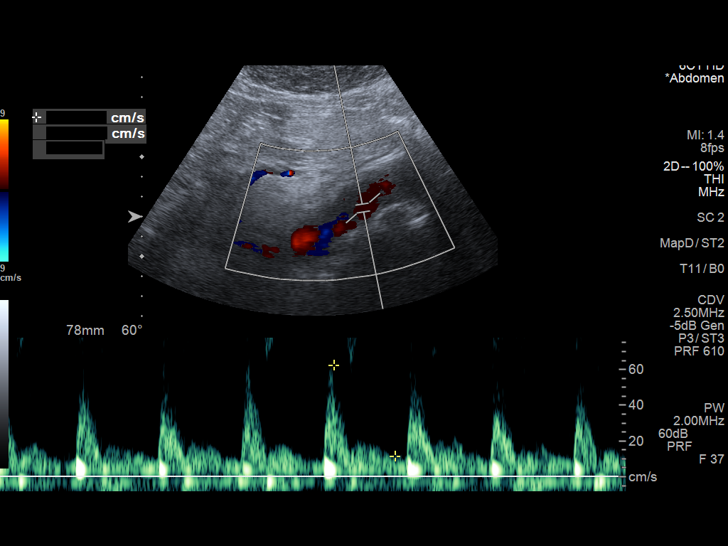
[im 11/18]
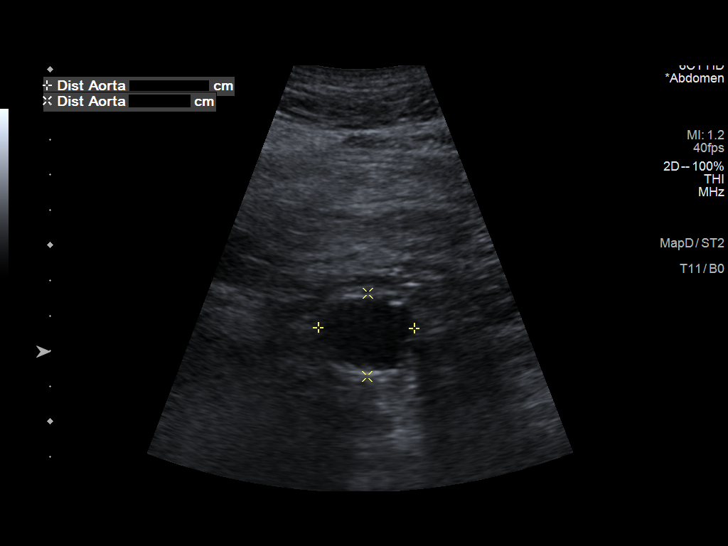
[im 13/18]
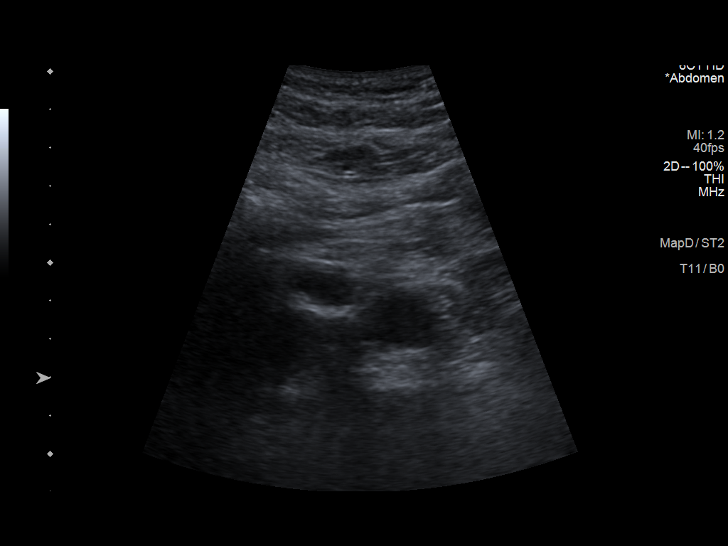
[im 14/18]
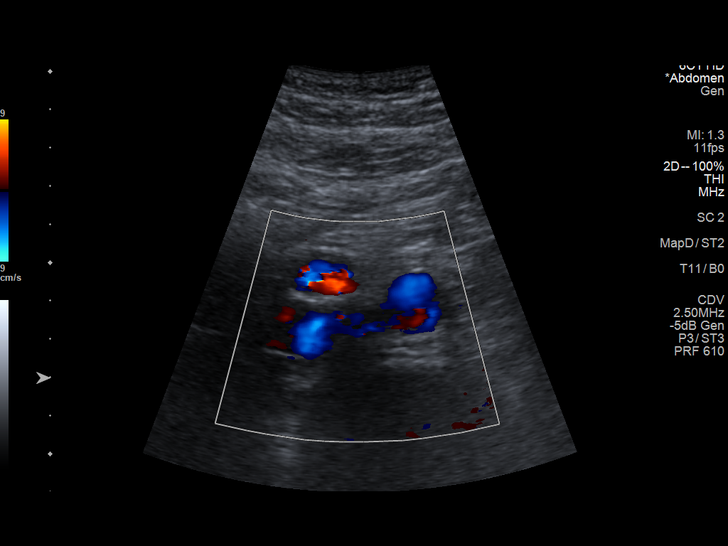
[im 15/18]
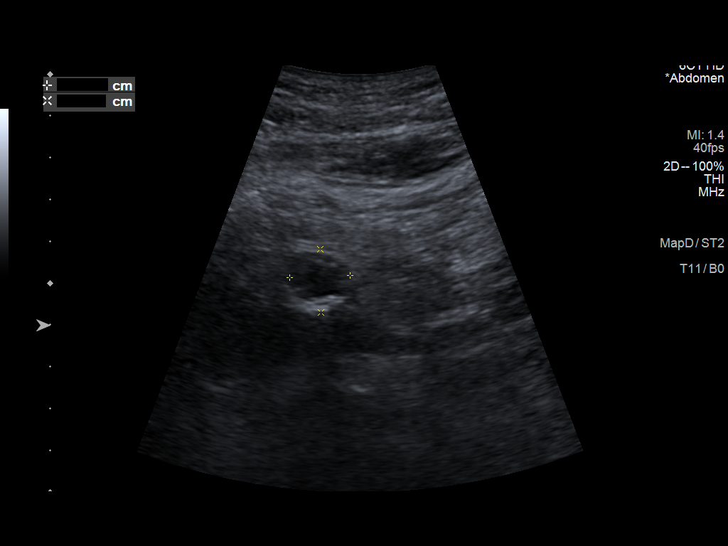
[im 17/18]
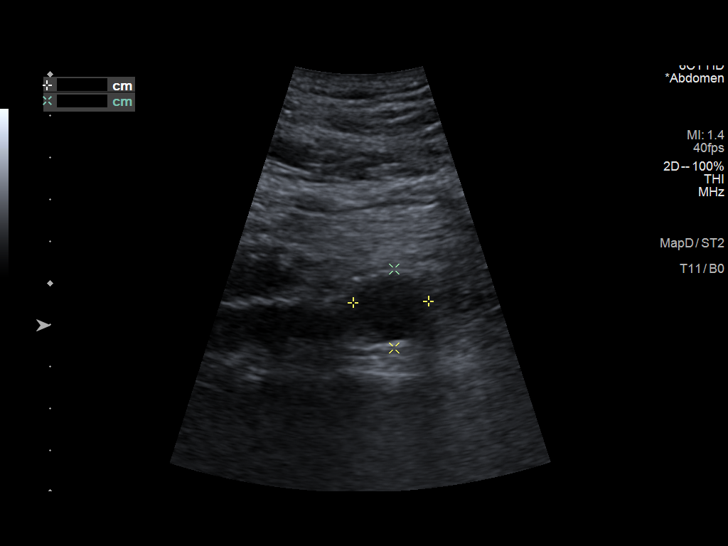
[im 18/18]
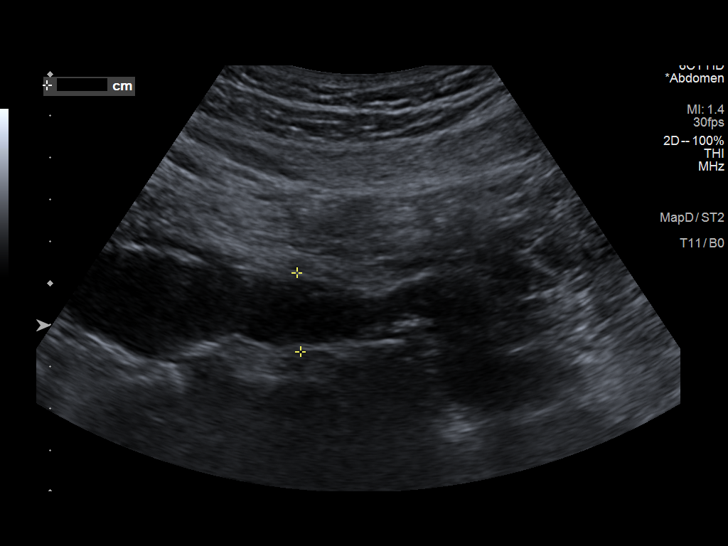

[14 of 18 positions shown; findings below may reference images not displayed]

FINDINGS: Abdominal aortic measurements as follows:

Proximal:  2.5 x 2.7 cm

Mid:  2.0 x 2.5 cm

Distal:  3.0 x 3.1 cm
Patent: Yes, peak systolic velocity is 62 cm/s

Right common iliac artery: 1.5 cm

Left common iliac artery: 1.7 cm
IMPRESSION: Stable mild aneurysmal dilatation of the distal abdominal aorta and
mild dilatation bilateral proximal common iliac arteries. Recommend
follow-up ultrasound every 3 years. This recommendation follows ACR
consensus guidelines: White Paper of the ACR Incidental Findings
Committee II on Vascular Findings. [HOSPITAL] [id number];

## 2022-08-09 DIAGNOSIS — K219 Gastro-esophageal reflux disease without esophagitis: Secondary | ICD-10-CM | POA: Insufficient documentation

## 2023-02-14 NOTE — Discharge Instructions (Signed)

## 2023-02-21 ENCOUNTER — Inpatient Hospital Stay
Admission: RE | Admit: 2023-02-21 | Discharge: 2023-02-21 | Disposition: A | Discharge status: Home / Self Care | Payer: Medicare Other | Source: Ambulatory Visit | Attending: Orthopedic Surgery | Admitting: Orthopedic Surgery

## 2023-02-21 ENCOUNTER — Other Ambulatory Visit: Payer: Self-pay

## 2023-02-21 DIAGNOSIS — Z01812 Encounter for preprocedural laboratory examination: Secondary | ICD-10-CM

## 2023-02-21 DIAGNOSIS — I10 Essential (primary) hypertension: Secondary | ICD-10-CM

## 2023-02-21 DIAGNOSIS — M1711 Unilateral primary osteoarthritis, right knee: Secondary | ICD-10-CM | POA: Insufficient documentation

## 2023-02-21 DIAGNOSIS — E119 Type 2 diabetes mellitus without complications: Secondary | ICD-10-CM | POA: Diagnosis not present

## 2023-02-21 DIAGNOSIS — Z01810 Encounter for preprocedural cardiovascular examination: Secondary | ICD-10-CM | POA: Diagnosis not present

## 2023-02-21 DIAGNOSIS — Z01818 Encounter for other preprocedural examination: Secondary | ICD-10-CM | POA: Insufficient documentation

## 2023-02-21 DIAGNOSIS — I714 Abdominal aortic aneurysm, without rupture, unspecified: Secondary | ICD-10-CM | POA: Diagnosis not present

## 2023-02-21 HISTORY — DX: Unspecified osteoarthritis, unspecified site: M19.90

## 2023-02-21 HISTORY — DX: Gastro-esophageal reflux disease without esophagitis: K21.9

## 2023-02-21 HISTORY — DX: Cardiac arrhythmia, unspecified: I49.9

## 2023-02-21 LAB — SURGICAL PCR SCREEN
MRSA, PCR: NEGATIVE
Staphylococcus aureus: NEGATIVE

## 2023-02-21 LAB — COMPREHENSIVE METABOLIC PANEL
ALT: 16 U/L (ref 0–44)
AST: 16 U/L (ref 15–41)
Albumin: 4.4 g/dL (ref 3.5–5.0)
Alkaline Phosphatase: 55 U/L (ref 38–126)
Anion gap: 11 (ref 5–15)
BUN: 20 mg/dL (ref 8–23)
CO2: 26 mmol/L (ref 22–32)
Calcium: 9.5 mg/dL (ref 8.9–10.3)
Chloride: 101 mmol/L (ref 98–111)
Creatinine, Ser: 0.91 mg/dL (ref 0.61–1.24)
GFR, Estimated: >60 mL/min (ref >60)
Glucose, Bld: 114 mg/dL — ABNORMAL HIGH (ref 70–99)
Potassium: 4.3 mmol/L (ref 3.5–5.1)
Sodium: 138 mmol/L (ref 135–145)
Total Bilirubin: 0.8 mg/dL (ref <1.2)
Total Protein: 7.6 g/dL (ref 6.5–8.1)

## 2023-02-21 LAB — URINALYSIS, ROUTINE W REFLEX MICROSCOPIC
Bacteria, UA: NONE SEEN
Bilirubin Urine: NEGATIVE
Glucose, UA: >=500 mg/dL — AB
Hgb urine dipstick: NEGATIVE
Ketones, ur: NEGATIVE mg/dL
Leukocytes,Ua: NEGATIVE
Nitrite: NEGATIVE
Protein, ur: NEGATIVE mg/dL
RBC / HPF: 0 RBC/hpf (ref 0–5)
Specific Gravity, Urine: 1.03 (ref 1.005–1.030)
pH: 5 (ref 5.0–8.0)

## 2023-02-21 LAB — CBC
HCT: 47.1 % (ref 39.0–52.0)
Hemoglobin: 15.7 g/dL (ref 13.0–17.0)
MCH: 28 pg (ref 26.0–34.0)
MCHC: 33.3 g/dL (ref 30.0–36.0)
MCV: 84.1 fL (ref 80.0–100.0)
Platelets: 206 10*3/uL (ref 150–400)
RBC: 5.6 MIL/uL (ref 4.22–5.81)
RDW: 13.3 % (ref 11.5–15.5)
WBC: 6.3 10*3/uL (ref 4.0–10.5)
nRBC: 0 % (ref 0.0–0.2)

## 2023-02-21 LAB — C-REACTIVE PROTEIN: CRP: 0.7 mg/dL (ref <1.0)

## 2023-02-21 LAB — HEMOGLOBIN A1C
Hgb A1c MFr Bld: 6.8 % — ABNORMAL HIGH (ref 4.8–5.6)
Mean Plasma Glucose: 148.46 mg/dL

## 2023-02-21 LAB — SEDIMENTATION RATE: Sed Rate: 3 mm/h (ref 0–20)

## 2023-02-21 NOTE — Patient Instructions (Addendum)
Your procedure is scheduled on: Friday, Jan 04/2023 Report to the Registration Desk on the 1st floor of the [facility]. To find out your arrival time, please call [phone] between 1PM - 3PM on: Thursday, Jan 2 /2025 If your arrival time is 6:00 am, do not arrive before that time as the [facility] entrance doors do not open until 6:00 am.  REMEMBER: Instructions that are not followed completely may result in serious medical risk, up to and including death; or upon the discretion of your surgeon and anesthesiologist your surgery may need to be rescheduled.  Do not eat food after midnight the night before surgery.  No gum chewing or hard candies.  You may however, drink CLEAR liquids up to 2 hours before you are scheduled to arrive for your surgery. Do not drink anything within 2 hours of your scheduled arrival time.  Clear liquids include: - water      In addition, your doctor has ordered for you to drink the provided:   Gatorade G2 Drinking this carbohydrate drink up to two hours before surgery helps to reduce insulin resistance and improve patient outcomes. Please complete drinking 2 hours before scheduled arrival time.  One week prior to surgery: Stop Anti-inflammatories (NSAIDS) such as Advil, Aleve, Ibuprofen, Motrin, Naproxen, Naprosyn and Aspirin based products such as Excedrin, Goody's Powder, BC Powder. Stop ANY OVER THE COUNTER supplements until after surgery.  You may however, continue to take Tylenol if needed for pain up until the day of surgery.    Jardiance- Hold for 3 days prior to surgery Metformin- Hold 2 days prior to surgery  **Follow recommendations regarding stopping blood thinners.**  You may continue taking aspirin per surgeon.  Continue taking all of your other prescription medications up until the day of surgery.  ON THE DAY OF SURGERY ONLY TAKE THESE MEDICATIONS WITH SIPS OF WATER:  amLODipine (NORVASC) finasteride (PROSCAR) pantoprazole  (PROTONIX)-as needed pravastatin (PRAVACHOL)   No Alcohol for 24 hours before or after surgery.  No Smoking including e-cigarettes for 24 hours before surgery.  No chewable tobacco products for at least 6 hours before surgery.  No nicotine patches on the day of surgery.  Do not use any "recreational" drugs for at least a week (preferably 2 weeks) before your surgery.  Please be advised that the combination of cocaine and anesthesia may have negative outcomes, up to and including death. If you test positive for cocaine, your surgery will be cancelled.  On the morning of surgery brush your teeth with toothpaste and water, you may rinse your mouth with mouthwash if you wish. Do not swallow any toothpaste or mouthwash.  Use CHG Soap or wipes as directed on instruction sheet.- provided for you   Do not wear jewelry, make-up, hairpins, clips or nail polish.  For welded (permanent) jewelry: bracelets, anklets, waist bands, etc.  Please have this removed prior to surgery.  If it is not removed, there is a chance that hospital personnel will need to cut it off on the day of surgery.  Do not wear lotions, powders, or perfumes.   Do not shave body hair from the neck down 48 hours before surgery.  Contact lenses, hearing aids and dentures may not be worn into surgery.  Do not bring valuables to the hospital. [facility] is not responsible for any missing/lost belongings or valuables.    Notify your doctor if there is any change in your medical condition (cold, fever, infection).  Wear comfortable clothing (specific  to your surgery type) to the hospital.  After surgery, you can help prevent lung complications by doing breathing exercises.  Take deep breaths and cough every 1-2 hours. Your doctor may order a device called an Incentive Spirometer to help you take deep breaths.   If you are being admitted to the hospital overnight, leave your suitcase in the car. After surgery it may be  brought to your room.   If you are being discharged the day of surgery, you will not be allowed to drive home. You will need a responsible individual to drive you home and stay with you for 24 hours after surgery.    Please call the Pre-admissions Testing Dept. at [phone] if you have any questions about these instructions.  Surgery Visitation Policy:  Patients having surgery or a procedure may have two visitors.  Children under the age of 56 must have an adult with them who is not the patient.  Inpatient Visitation:    Visiting hours are 7 a.m. to 8 p.m. Up to four visitors are allowed at one time in a patient room. The visitors may rotate out with other people during the day.  One visitor age 16 or older may stay with the patient overnight and must be in the room by 8 p.m.    Pre-operative 5 CHG Bath Instructions   You can play a key role in reducing the risk of infection after surgery. Your skin needs to be as free of germs as possible. You can reduce the number of germs on your skin by washing with CHG (chlorhexidine gluconate) soap before surgery. CHG is an antiseptic soap that kills germs and continues to kill germs even after washing.   DO NOT use if you have an allergy to chlorhexidine/CHG or antibacterial soaps. If your skin becomes reddened or irritated, stop using the CHG and notify one of our RNs at [phone].   Please shower with the CHG soap starting 4 days before surgery using the following schedule:        Please keep in mind the following:  DO NOT shave, including legs and underarms, starting the day of your first shower.   You may shave your face at any point before/day of surgery.  Place clean sheets on your bed the day you start using CHG soap. Use a clean washcloth (not used since being washed) for each shower. DO NOT sleep with pets once you start using the CHG.   CHG Shower Instructions:  If you choose to wash your hair and private area, wash  first with your normal shampoo/soap.  After you use shampoo/soap, rinse your hair and body thoroughly to remove shampoo/soap residue.  Turn the water OFF and apply about 3 tablespoons (45 ml) of CHG soap to a CLEAN washcloth.  Apply CHG soap ONLY FROM YOUR NECK DOWN TO YOUR TOES (washing for 3-5 minutes)  DO NOT use CHG soap on face, private areas, open wounds, or sores.  Pay special attention to the area where your surgery is being performed.  If you are having back surgery, having someone wash your back for you may be helpful. Wait 2 minutes after CHG soap is applied, then you may rinse off the CHG soap.  Pat dry with a clean towel  Put on clean clothes/pajamas   If you choose to wear lotion, please use ONLY the CHG-compatible lotions on the back of this paper.     Additional instructions for the day of surgery: DO NOT  APPLY any lotions, deodorants, cologne, or perfumes.   Put on clean/comfortable clothes.  Brush your teeth.  Ask your nurse before applying any prescription medications to the skin.      CHG Compatible Lotions   Aveeno Moisturizing lotion  Cetaphil Moisturizing Cream  Cetaphil Moisturizing Lotion  Clairol Herbal Essence Moisturizing Lotion, Dry Skin  Clairol Herbal Essence Moisturizing Lotion, Extra Dry Skin  Clairol Herbal Essence Moisturizing Lotion, Normal Skin  Curel Age Defying Therapeutic Moisturizing Lotion with Alpha Hydroxy  Curel Extreme Care Body Lotion  Curel Soothing Hands Moisturizing Hand Lotion  Curel Therapeutic Moisturizing Cream, Fragrance-Free  Curel Therapeutic Moisturizing Lotion, Fragrance-Free  Curel Therapeutic Moisturizing Lotion, Original Formula  Eucerin Daily Replenishing Lotion  Eucerin Dry Skin Therapy Plus Alpha Hydroxy Crme  Eucerin Dry Skin Therapy Plus Alpha Hydroxy Lotion  Eucerin Original Crme  Eucerin Original Lotion  Eucerin Plus Crme Eucerin Plus Lotion  Eucerin TriLipid Replenishing Lotion  Keri Anti-Bacterial  Hand Lotion  Keri Deep Conditioning Original Lotion Dry Skin Formula Softly Scented  Keri Deep Conditioning Original Lotion, Fragrance Free Sensitive Skin Formula  Keri Lotion Fast Absorbing Fragrance Free Sensitive Skin Formula  Keri Lotion Fast Absorbing Softly Scented Dry Skin Formula  Keri Original Lotion  Keri Skin Renewal Lotion Keri Silky Smooth Lotion  Keri Silky Smooth Sensitive Skin Lotion  Nivea Body Creamy Conditioning Oil  Nivea Body Extra Enriched Lotion  Nivea Body Original Lotion  Nivea Body Sheer Moisturizing Lotion Nivea Crme  Nivea Skin Firming Lotion  NutraDerm 30 Skin Lotion  NutraDerm Skin Lotion  NutraDerm Therapeutic Skin Cream  NutraDerm Therapeutic Skin Lotion  ProShield Protective Hand Cream  Provon moisturizing lotion   Preoperative Educational Videos for Total Hip, Knee and Shoulder Replacements  To better prepare for surgery, please view our videos that explain the physical activity and discharge planning required to have the best surgical recovery at [facility].  [email or web address]  Questions? Call [phone] or email [email or web address]   Please scan the QR code and click the link to view the website with video.        How to Use an Incentive Spirometer  An incentive spirometer is a tool that measures how well you are filling your lungs with each breath. Learning to take long, deep breaths using this tool can help you keep your lungs clear and active. This may help to reverse or lessen your chance of developing breathing (pulmonary) problems, especially infection. You may be asked to use a spirometer: After a surgery. If you have a lung problem or a history of smoking. After a long period of time when you have been unable to move or be active. If the spirometer includes an indicator to show the highest number that you have reached, your health care  provider or respiratory therapist will help you set a goal. Keep a log of your progress as told by your health care provider. What are the risks? Breathing too quickly may cause dizziness or cause you to pass out. Take your time so you do not get dizzy or light-headed. If you are in pain, you may need to take pain medicine before doing incentive spirometry. It is harder to take a deep breath if you are having pain. How to use your incentive spirometer  Sit up on the edge of your bed or on a chair. Hold the incentive spirometer so that it is in an upright position. Before you use the spirometer, breathe out normally. Place the mouthpiece in your  mouth. Make sure your lips are closed tightly around it. Breathe in slowly and as deeply as you can through your mouth, causing the piston or the ball to rise toward the top of the chamber. Hold your breath for 3-5 seconds, or for as long as possible. If the spirometer includes a coach indicator, use this to guide you in breathing. Slow down your breathing if the indicator goes above the marked areas. Remove the mouthpiece from your mouth and breathe out normally. The piston or ball will return to the bottom of the chamber. Rest for a few seconds, then repeat the steps 10 or more times. Take your time and take a few normal breaths between deep breaths so that you do not get dizzy or light-headed. Do this every 1-2 hours when you are awake. If the spirometer includes a goal marker to show the highest number you have reached (best effort), use this as a goal to work toward during each repetition. After each set of 10 deep breaths, cough a few times. This will help to make sure that your lungs are clear. If you have an incision on your chest or abdomen from surgery, place a pillow or a rolled-up towel firmly against the incision when you cough. This can help to reduce pain while taking deep breaths and coughing. General tips When you are able to get out of  bed: Walk around often. Continue to take deep breaths and cough in order to clear your lungs. Keep using the incentive spirometer until your health care provider says it is okay to stop using it. If you have been in the hospital, you may be told to keep using the spirometer at home. Contact a health care provider if: You are having difficulty using the spirometer. You have trouble using the spirometer as often as instructed. Your pain medicine is not giving enough relief for you to use the spirometer as told. You have a fever. Get help right away if: You develop shortness of breath. You develop a cough with bloody mucus from the lungs. You have fluid or blood coming from an incision site after you cough. Summary An incentive spirometer is a tool that can help you learn to take long, deep breaths to keep your lungs clear and active. You may be asked to use a spirometer after a surgery, if you have a lung problem or a history of smoking, or if you have been inactive for a long period of time. Use your incentive spirometer as instructed every 1-2 hours while you are awake. If you have an incision on your chest or abdomen, place a pillow or a rolled-up towel firmly against your incision when you cough. This will help to reduce pain. Get help right away if you have shortness of breath, you cough up bloody mucus, or blood comes from your incision when you cough. This information is not intended to replace advice given to you by your health care provider. Make sure you discuss any questions you have with your health care provider. Document Revised: 05/07/2019 Document Reviewed: 05/07/2019 Elsevier Patient Education  2023 [employer].

## 2023-02-27 ENCOUNTER — Encounter: Payer: Self-pay | Admitting: Orthopedic Surgery

## 2023-02-27 DIAGNOSIS — M7070 Other bursitis of hip, unspecified hip: Secondary | ICD-10-CM | POA: Insufficient documentation

## 2023-03-03 MED ORDER — DEXAMETHASONE SODIUM PHOSPHATE 10 MG/ML IJ SOLN
8.0000 mg | Freq: Once | INTRAMUSCULAR | Status: AC
  Administered 2023-03-04: 8 mg via INTRAVENOUS

## 2023-03-03 MED ORDER — CHLORHEXIDINE GLUCONATE 0.12 % MT SOLN
15.0000 mL | Freq: Once | OROMUCOSAL | Status: AC
  Administered 2023-03-04: 15 mL via OROMUCOSAL

## 2023-03-03 MED ORDER — ORAL CARE MOUTH RINSE: 15.0000 mL | Freq: Once | OROMUCOSAL | Status: AC

## 2023-03-03 MED ORDER — SODIUM CHLORIDE 0.9% FLUSH
3.0000 mL | Freq: Two times a day (BID) | INTRAVENOUS | Status: DC
  Administered 2023-03-04: 10 mL via INTRAVENOUS

## 2023-03-03 MED ORDER — CHLORHEXIDINE GLUCONATE 4 % EX SOLN
60.0000 mL | Freq: Once | CUTANEOUS | Status: AC
  Administered 2023-03-04: 4 via TOPICAL

## 2023-03-03 MED ORDER — TRANEXAMIC ACID-NACL 1000-0.7 MG/100ML-% IV SOLN: 1000.0000 mg | INTRAVENOUS | Status: DC

## 2023-03-03 MED ORDER — CELECOXIB 200 MG PO CAPS
400.0000 mg | ORAL_CAPSULE | Freq: Once | ORAL | Status: AC
  Administered 2023-03-04: 400 mg via ORAL

## 2023-03-03 MED ORDER — CEFAZOLIN SODIUM-DEXTROSE 2-4 GM/100ML-% IV SOLN
2.0000 g | INTRAVENOUS | Status: AC
  Administered 2023-03-04: 2 g via INTRAVENOUS

## 2023-03-03 MED ORDER — SODIUM CHLORIDE 0.9% FLUSH: 3.0000 mL | INTRAVENOUS | Status: DC | PRN

## 2023-03-03 MED ORDER — GABAPENTIN 300 MG PO CAPS
300.0000 mg | ORAL_CAPSULE | Freq: Once | ORAL | Status: AC
  Administered 2023-03-04: 300 mg via ORAL

## 2023-03-04 ENCOUNTER — Observation Stay
Admission: RE | Admit: 2023-03-04 | Discharge: 2023-03-05 | Disposition: A | Discharge status: Home Health | Payer: Medicare Other | Attending: Orthopedic Surgery | Admitting: Orthopedic Surgery

## 2023-03-04 ENCOUNTER — Ambulatory Visit: Payer: Medicare Other | Admitting: Urgent Care

## 2023-03-04 ENCOUNTER — Encounter
Admission: RE | Disposition: A | Discharge status: Home Health | Payer: Self-pay | Source: Home / Self Care | Attending: Orthopedic Surgery

## 2023-03-04 ENCOUNTER — Observation Stay: Payer: Medicare Other

## 2023-03-04 ENCOUNTER — Encounter: Payer: Self-pay | Admitting: Orthopedic Surgery

## 2023-03-04 ENCOUNTER — Other Ambulatory Visit: Payer: Self-pay

## 2023-03-04 DIAGNOSIS — Z01812 Encounter for preprocedural laboratory examination: Secondary | ICD-10-CM

## 2023-03-04 DIAGNOSIS — I10 Essential (primary) hypertension: Secondary | ICD-10-CM

## 2023-03-04 DIAGNOSIS — M1711 Unilateral primary osteoarthritis, right knee: Principal | ICD-10-CM

## 2023-03-04 DIAGNOSIS — F17200 Nicotine dependence, unspecified, uncomplicated: Secondary | ICD-10-CM

## 2023-03-04 DIAGNOSIS — E119 Type 2 diabetes mellitus without complications: Secondary | ICD-10-CM | POA: Diagnosis not present

## 2023-03-04 DIAGNOSIS — Z96651 Presence of right artificial knee joint: Secondary | ICD-10-CM

## 2023-03-04 DIAGNOSIS — I714 Abdominal aortic aneurysm, without rupture, unspecified: Secondary | ICD-10-CM

## 2023-03-04 DIAGNOSIS — Z7982 Long term (current) use of aspirin: Secondary | ICD-10-CM | POA: Diagnosis not present

## 2023-03-04 DIAGNOSIS — Z79899 Other long term (current) drug therapy: Secondary | ICD-10-CM | POA: Diagnosis not present

## 2023-03-04 DIAGNOSIS — E1159 Type 2 diabetes mellitus with other circulatory complications: Secondary | ICD-10-CM

## 2023-03-04 DIAGNOSIS — Z7984 Long term (current) use of oral hypoglycemic drugs: Secondary | ICD-10-CM | POA: Diagnosis not present

## 2023-03-04 HISTORY — PX: KNEE ARTHROPLASTY: SHX992

## 2023-03-04 LAB — GLUCOSE, CAPILLARY
Glucose-Capillary: 157 mg/dL — ABNORMAL HIGH (ref 70–99)
Glucose-Capillary: 207 mg/dL — ABNORMAL HIGH (ref 70–99)
Glucose-Capillary: 234 mg/dL — ABNORMAL HIGH (ref 70–99)

## 2023-03-04 SURGERY — ARTHROPLASTY, KNEE, TOTAL, USING IMAGELESS COMPUTER-ASSISTED NAVIGATION
Anesthesia: Spinal | Site: Knee | Laterality: Right

## 2023-03-04 MED ORDER — SODIUM CHLORIDE 0.9 % IV SOLN: INTRAVENOUS | Status: DC | PRN

## 2023-03-04 MED ORDER — SODIUM CHLORIDE 0.9 % IV SOLN: INTRAVENOUS | Status: DC

## 2023-03-04 MED ORDER — TRAMADOL HCL 50 MG PO TABS
50.0000 mg | ORAL_TABLET | ORAL | Status: DC | PRN
  Filled 2023-03-04: qty 1

## 2023-03-04 MED ORDER — ACETAMINOPHEN 10 MG/ML IV SOLN
1000.0000 mg | Freq: Once | INTRAVENOUS | Status: DC | PRN
Start: 2023-03-04 — End: 2023-03-04

## 2023-03-04 MED ORDER — ACETAMINOPHEN 10 MG/ML IV SOLN
INTRAVENOUS | Status: DC | PRN
  Administered 2023-03-04: 1000 mg via INTRAVENOUS

## 2023-03-04 MED ORDER — PRAVASTATIN SODIUM 20 MG PO TABS
20.0000 mg | ORAL_TABLET | Freq: Every day | ORAL | Status: DC
  Administered 2023-03-05: 20 mg via ORAL
  Filled 2023-03-04: qty 1

## 2023-03-04 MED ORDER — BUPIVACAINE LIPOSOME 1.3 % IJ SUSP
INTRAMUSCULAR | Status: AC
Start: 2023-03-04 — End: ?
  Filled 2023-03-04: qty 20

## 2023-03-04 MED ORDER — DROPERIDOL 2.5 MG/ML IJ SOLN: 0.6250 mg | Freq: Once | INTRAMUSCULAR | Status: DC | PRN

## 2023-03-04 MED ORDER — ACETAMINOPHEN 10 MG/ML IV SOLN
INTRAVENOUS | Status: AC
  Filled 2023-03-04: qty 100

## 2023-03-04 MED ORDER — PROPOFOL 500 MG/50ML IV EMUL
INTRAVENOUS | Status: DC | PRN
  Administered 2023-03-04: 80 ug/kg/min via INTRAVENOUS

## 2023-03-04 MED ORDER — DEXAMETHASONE SODIUM PHOSPHATE 10 MG/ML IJ SOLN
INTRAMUSCULAR | Status: AC
  Filled 2023-03-04: qty 1

## 2023-03-04 MED ORDER — HYDROMORPHONE HCL 1 MG/ML IJ SOLN: 0.5000 mg | INTRAMUSCULAR | Status: DC | PRN

## 2023-03-04 MED ORDER — SODIUM CHLORIDE 0.9 % IR SOLN
Status: DC | PRN
  Administered 2023-03-04: 3000 mL

## 2023-03-04 MED ORDER — FERROUS SULFATE 325 (65 FE) MG PO TABS
325.0000 mg | ORAL_TABLET | Freq: Two times a day (BID) | ORAL | Status: DC
  Administered 2023-03-05: 325 mg via ORAL
  Filled 2023-03-04: qty 1

## 2023-03-04 MED ORDER — BUPIVACAINE HCL (PF) 0.5 % IJ SOLN
INTRAMUSCULAR | Status: DC | PRN
  Administered 2023-03-04: 2.8 mL

## 2023-03-04 MED ORDER — OXYCODONE HCL 5 MG/5ML PO SOLN: 5.0000 mg | Freq: Once | ORAL | Status: DC | PRN

## 2023-03-04 MED ORDER — ONDANSETRON HCL 4 MG/2ML IJ SOLN: 4.0000 mg | Freq: Four times a day (QID) | INTRAMUSCULAR | Status: DC | PRN

## 2023-03-04 MED ORDER — ONDANSETRON HCL 4 MG/2ML IJ SOLN
INTRAMUSCULAR | Status: DC | PRN
  Administered 2023-03-04: 4 mg via INTRAVENOUS

## 2023-03-04 MED ORDER — ACETAMINOPHEN 325 MG PO TABS: 325.0000 mg | ORAL_TABLET | Freq: Four times a day (QID) | ORAL | Status: DC | PRN

## 2023-03-04 MED ORDER — PROPOFOL 10 MG/ML IV BOLUS
INTRAVENOUS | Status: AC
  Filled 2023-03-04: qty 20

## 2023-03-04 MED ORDER — SODIUM CHLORIDE (PF) 0.9 % IJ SOLN
INTRAMUSCULAR | Status: DC | PRN
  Administered 2023-03-04: 120 mL

## 2023-03-04 MED ORDER — TRANEXAMIC ACID-NACL 1000-0.7 MG/100ML-% IV SOLN
1000.0000 mg | Freq: Once | INTRAVENOUS | Status: AC
  Administered 2023-03-04: 1000 mg via INTRAVENOUS

## 2023-03-04 MED ORDER — CEFAZOLIN SODIUM-DEXTROSE 2-4 GM/100ML-% IV SOLN
2.0000 g | Freq: Four times a day (QID) | INTRAVENOUS | Status: AC
Start: 2023-03-04 — End: 2023-03-05
  Administered 2023-03-04 – 2023-03-05 (×2): 2 g via INTRAVENOUS
  Filled 2023-03-04 (×2): qty 100

## 2023-03-04 MED ORDER — FENTANYL CITRATE (PF) 100 MCG/2ML IJ SOLN
INTRAMUSCULAR | Status: DC | PRN
  Administered 2023-03-04 (×2): 25 ug via INTRAVENOUS

## 2023-03-04 MED ORDER — EMPAGLIFLOZIN 25 MG PO TABS
25.0000 mg | ORAL_TABLET | Freq: Every day | ORAL | Status: DC
  Administered 2023-03-04 – 2023-03-05 (×2): 25 mg via ORAL
  Filled 2023-03-04 (×2): qty 1

## 2023-03-04 MED ORDER — FENTANYL CITRATE (PF) 100 MCG/2ML IJ SOLN: 25.0000 ug | INTRAMUSCULAR | Status: DC | PRN

## 2023-03-04 MED ORDER — METOCLOPRAMIDE HCL 5 MG PO TABS
10.0000 mg | ORAL_TABLET | Freq: Three times a day (TID) | ORAL | Status: DC
  Administered 2023-03-04 – 2023-03-05 (×2): 10 mg via ORAL
  Filled 2023-03-04 (×2): qty 2

## 2023-03-04 MED ORDER — SURGIPHOR WOUND IRRIGATION SYSTEM - OPTIME: TOPICAL | Status: DC | PRN

## 2023-03-04 MED ORDER — PROPOFOL 1000 MG/100ML IV EMUL
INTRAVENOUS | Status: AC
  Filled 2023-03-04: qty 100

## 2023-03-04 MED ORDER — MAGNESIUM HYDROXIDE 400 MG/5ML PO SUSP
30.0000 mL | Freq: Every day | ORAL | Status: DC
  Administered 2023-03-04 – 2023-03-05 (×2): 30 mL via ORAL
  Filled 2023-03-04 (×2): qty 30

## 2023-03-04 MED ORDER — ASPIRIN 81 MG PO CHEW
81.0000 mg | CHEWABLE_TABLET | Freq: Two times a day (BID) | ORAL | Status: DC
  Administered 2023-03-04 – 2023-03-05 (×2): 81 mg via ORAL
  Filled 2023-03-04 (×2): qty 1

## 2023-03-04 MED ORDER — MIDAZOLAM HCL 5 MG/5ML IJ SOLN
INTRAMUSCULAR | Status: DC | PRN
  Administered 2023-03-04: 2 mg via INTRAVENOUS

## 2023-03-04 MED ORDER — PROPOFOL 10 MG/ML IV BOLUS
INTRAVENOUS | Status: DC | PRN
  Administered 2023-03-04: 40 mg via INTRAVENOUS

## 2023-03-04 MED ORDER — ACETAMINOPHEN 10 MG/ML IV SOLN
1000.0000 mg | Freq: Four times a day (QID) | INTRAVENOUS | Status: DC
  Administered 2023-03-04 – 2023-03-05 (×2): 1000 mg via INTRAVENOUS
  Filled 2023-03-04 (×2): qty 100

## 2023-03-04 MED ORDER — BUPIVACAINE HCL (PF) 0.5 % IJ SOLN
INTRAMUSCULAR | Status: AC
  Filled 2023-03-04: qty 10

## 2023-03-04 MED ORDER — CHLORHEXIDINE GLUCONATE 0.12 % MT SOLN
OROMUCOSAL | Status: AC
  Filled 2023-03-04: qty 15

## 2023-03-04 MED ORDER — AMLODIPINE BESYLATE 5 MG PO TABS
5.0000 mg | ORAL_TABLET | Freq: Every day | ORAL | Status: DC
  Administered 2023-03-05: 5 mg via ORAL
  Filled 2023-03-04: qty 1

## 2023-03-04 MED ORDER — LIDOCAINE HCL (PF) 2 % IJ SOLN
INTRAMUSCULAR | Status: AC
Start: 2023-03-04 — End: ?
  Filled 2023-03-04: qty 5

## 2023-03-04 MED ORDER — FINASTERIDE 5 MG PO TABS
5.0000 mg | ORAL_TABLET | Freq: Every day | ORAL | Status: DC
  Filled 2023-03-04: qty 1

## 2023-03-04 MED ORDER — CELECOXIB 200 MG PO CAPS
ORAL_CAPSULE | ORAL | Status: AC
  Filled 2023-03-04: qty 2

## 2023-03-04 MED ORDER — PANTOPRAZOLE SODIUM 40 MG PO TBEC
40.0000 mg | DELAYED_RELEASE_TABLET | Freq: Two times a day (BID) | ORAL | Status: DC
  Administered 2023-03-04 – 2023-03-05 (×2): 40 mg via ORAL
  Filled 2023-03-04 (×2): qty 1

## 2023-03-04 MED ORDER — MIDAZOLAM HCL 2 MG/2ML IJ SOLN
INTRAMUSCULAR | Status: AC
  Filled 2023-03-04: qty 2

## 2023-03-04 MED ORDER — LIDOCAINE HCL (CARDIAC) PF 100 MG/5ML IV SOSY
PREFILLED_SYRINGE | INTRAVENOUS | Status: DC | PRN
  Administered 2023-03-04: 30 mg via INTRAVENOUS

## 2023-03-04 MED ORDER — ONDANSETRON HCL 4 MG PO TABS: 4.0000 mg | ORAL_TABLET | Freq: Four times a day (QID) | ORAL | Status: DC | PRN

## 2023-03-04 MED ORDER — FENTANYL CITRATE (PF) 100 MCG/2ML IJ SOLN
INTRAMUSCULAR | Status: AC
  Filled 2023-03-04: qty 2

## 2023-03-04 MED ORDER — OXYCODONE HCL 5 MG PO TABS
10.0000 mg | ORAL_TABLET | ORAL | Status: DC | PRN
Start: 2023-03-04 — End: 2023-03-05
  Administered 2023-03-05: 10 mg via ORAL
  Filled 2023-03-04 (×2): qty 2

## 2023-03-04 MED ORDER — TRANEXAMIC ACID-NACL 1000-0.7 MG/100ML-% IV SOLN
INTRAVENOUS | Status: AC
  Filled 2023-03-04: qty 100

## 2023-03-04 MED ORDER — SENNOSIDES-DOCUSATE SODIUM 8.6-50 MG PO TABS
1.0000 | ORAL_TABLET | Freq: Two times a day (BID) | ORAL | Status: DC
  Administered 2023-03-04 – 2023-03-05 (×2): 1 via ORAL
  Filled 2023-03-04 (×2): qty 1

## 2023-03-04 MED ORDER — MENTHOL 3 MG MT LOZG: 1.0000 | LOZENGE | OROMUCOSAL | Status: DC | PRN

## 2023-03-04 MED ORDER — PHENOL 1.4 % MT LIQD: 1.0000 | OROMUCOSAL | Status: DC | PRN

## 2023-03-04 MED ORDER — OXYCODONE HCL 5 MG PO TABS: 5.0000 mg | ORAL_TABLET | Freq: Once | ORAL | Status: DC | PRN

## 2023-03-04 MED ORDER — TRANEXAMIC ACID-NACL 1000-0.7 MG/100ML-% IV SOLN
INTRAVENOUS | Status: DC | PRN
  Administered 2023-03-04: 1000 mg via INTRAVENOUS

## 2023-03-04 MED ORDER — SODIUM CHLORIDE FLUSH 0.9 % IV SOLN
INTRAVENOUS | Status: AC
  Filled 2023-03-04: qty 40

## 2023-03-04 MED ORDER — FLEET ENEMA RE ENEM: 1.0000 | ENEMA | Freq: Once | RECTAL | Status: DC | PRN

## 2023-03-04 MED ORDER — INSULIN ASPART 100 UNIT/ML IJ SOLN: 0.0000 [IU] | Freq: Every day | INTRAMUSCULAR | Status: DC

## 2023-03-04 MED ORDER — OXYCODONE HCL 5 MG PO TABS
5.0000 mg | ORAL_TABLET | ORAL | Status: DC | PRN
Start: 2023-03-04 — End: 2023-03-05
  Administered 2023-03-04 – 2023-03-05 (×2): 5 mg via ORAL
  Filled 2023-03-04: qty 1

## 2023-03-04 MED ORDER — BUPIVACAINE HCL (PF) 0.25 % IJ SOLN
INTRAMUSCULAR | Status: AC
  Filled 2023-03-04: qty 60

## 2023-03-04 MED ORDER — ALUM & MAG HYDROXIDE-SIMETH 200-200-20 MG/5ML PO SUSP: 30.0000 mL | ORAL | Status: DC | PRN

## 2023-03-04 MED ORDER — METFORMIN HCL ER 500 MG PO TB24
500.0000 mg | ORAL_TABLET | Freq: Two times a day (BID) | ORAL | Status: DC
  Administered 2023-03-04 – 2023-03-05 (×2): 500 mg via ORAL
  Filled 2023-03-04 (×3): qty 1

## 2023-03-04 MED ORDER — GABAPENTIN 300 MG PO CAPS
ORAL_CAPSULE | ORAL | Status: AC
  Filled 2023-03-04: qty 1

## 2023-03-04 MED ORDER — DIPHENHYDRAMINE HCL 12.5 MG/5ML PO ELIX: 12.5000 mg | ORAL_SOLUTION | ORAL | Status: DC | PRN

## 2023-03-04 MED ORDER — INSULIN ASPART 100 UNIT/ML IJ SOLN
0.0000 [IU] | Freq: Three times a day (TID) | INTRAMUSCULAR | Status: DC
Start: 2023-03-05 — End: 2023-03-05
  Filled 2023-03-04: qty 1

## 2023-03-04 MED ORDER — CELECOXIB 200 MG PO CAPS
200.0000 mg | ORAL_CAPSULE | Freq: Two times a day (BID) | ORAL | Status: DC
  Administered 2023-03-04 – 2023-03-05 (×2): 200 mg via ORAL
  Filled 2023-03-04 (×2): qty 1

## 2023-03-04 MED ORDER — BISACODYL 10 MG RE SUPP: 10.0000 mg | Freq: Every day | RECTAL | Status: DC | PRN

## 2023-03-04 MED ORDER — CEFAZOLIN SODIUM-DEXTROSE 2-4 GM/100ML-% IV SOLN
INTRAVENOUS | Status: AC
  Filled 2023-03-04: qty 100

## 2023-03-04 SURGICAL SUPPLY — 66 items
ATTUNE MED DOME PAT 41 KNEE (Knees) IMPLANT
ATTUNE PS FEM RT SZ 7 CEM KNEE (Femur) IMPLANT
ATTUNE PSRP INSR SZ7 6 KNEE (Insert) IMPLANT
BASE TIBIAL ROT PLAT SZ 8 KNEE (Knees) IMPLANT
BATTERY INSTRU NAVIGATION (MISCELLANEOUS) ×4 IMPLANT
BIT DRILL QUICK REL 1/8 2PK SL (BIT) ×1 IMPLANT
BLADE CLIPPER SURG (BLADE) IMPLANT
BLADE SAW 70X12.5 (BLADE) ×1 IMPLANT
BLADE SAW 90X13X1.19 OSCILLAT (BLADE) ×1 IMPLANT
BLADE SAW 90X25X1.19 OSCILLAT (BLADE) ×1 IMPLANT
BONE CEMENT GENTAMICIN (Cement) ×2 IMPLANT
BRUSH SCRUB EZ PLAIN DRY (MISCELLANEOUS) ×1 IMPLANT
CEMENT BONE GENTAMICIN 40 (Cement) IMPLANT
COOLER POLAR GLACIER W/PUMP (MISCELLANEOUS) ×1 IMPLANT
CUFF TRNQT CYL 24X4X16.5-23 (TOURNIQUET CUFF) IMPLANT
CUFF TRNQT CYL 30X4X21-28X (TOURNIQUET CUFF) IMPLANT
DRAPE SHEET LG 3/4 BI-LAMINATE (DRAPES) ×1 IMPLANT
DRSG AQUACEL AG ADV 3.5X14 (GAUZE/BANDAGES/DRESSINGS) ×1 IMPLANT
DRSG MEPILEX SACRM 8.7X9.8 (GAUZE/BANDAGES/DRESSINGS) ×1 IMPLANT
DRSG TEGADERM 4X4.75 (GAUZE/BANDAGES/DRESSINGS) ×1 IMPLANT
DURAPREP 26ML APPLICATOR (WOUND CARE) ×2 IMPLANT
ELECT CAUTERY BLADE 6.4 (BLADE) ×1 IMPLANT
ELECT REM PT RETURN 9FT ADLT (ELECTROSURGICAL) ×1
ELECTRODE REM PT RTRN 9FT ADLT (ELECTROSURGICAL) ×1 IMPLANT
EVACUATOR 1/8 PVC DRAIN (DRAIN) ×1 IMPLANT
EX-PIN ORTHOLOCK NAV 4X150 (PIN) ×2 IMPLANT
GAUZE XEROFORM 1X8 LF (GAUZE/BANDAGES/DRESSINGS) ×1 IMPLANT
GLOVE BIOGEL M STRL SZ7.5 (GLOVE) ×6 IMPLANT
GLOVE SURG UNDER POLY LF SZ8 (GLOVE) ×2 IMPLANT
GOWN STRL REUS W/ TWL LRG LVL3 (GOWN DISPOSABLE) ×1 IMPLANT
GOWN STRL REUS W/ TWL XL LVL3 (GOWN DISPOSABLE) ×1 IMPLANT
GOWN TOGA ZIPPER T7+ PEEL AWAY (MISCELLANEOUS) ×1 IMPLANT
HOLDER FOLEY CATH W/STRAP (MISCELLANEOUS) ×1 IMPLANT
HOOD PEEL AWAY T7 (MISCELLANEOUS) ×1 IMPLANT
IV NS IRRIG 3000ML ARTHROMATIC (IV SOLUTION) ×1 IMPLANT
KIT TURNOVER KIT A (KITS) ×1 IMPLANT
KNIFE SCULPS 14X20 (INSTRUMENTS) ×1 IMPLANT
MANIFOLD NEPTUNE II (INSTRUMENTS) ×2 IMPLANT
NDL SPNL 20GX3.5 QUINCKE YW (NEEDLE) ×2 IMPLANT
NEEDLE SPNL 20GX3.5 QUINCKE YW (NEEDLE) ×2 IMPLANT
PACK TOTAL KNEE (MISCELLANEOUS) ×1 IMPLANT
PAD ABD DERMACEA PRESS 5X9 (GAUZE/BANDAGES/DRESSINGS) ×2 IMPLANT
PAD ARMBOARD 7.5X6 YLW CONV (MISCELLANEOUS) ×3 IMPLANT
PAD WRAPON POLAR KNEE (MISCELLANEOUS) ×1 IMPLANT
PENCIL SMOKE EVACUATOR COATED (MISCELLANEOUS) ×1 IMPLANT
PIN DRILL FIX HALF THREAD (BIT) ×2 IMPLANT
PIN FIXATION 1/8DIA X 3INL (PIN) ×1 IMPLANT
PULSAVAC PLUS IRRIG FAN TIP (DISPOSABLE) ×1
SOLUTION IRRIG SURGIPHOR (IV SOLUTION) ×1 IMPLANT
SPONGE DRAIN TRACH 4X4 STRL 2S (GAUZE/BANDAGES/DRESSINGS) ×1 IMPLANT
STAPLER SKIN PROX 35W (STAPLE) ×1 IMPLANT
STOCKINETTE STRL BIAS CUT 8X4 (MISCELLANEOUS) ×1 IMPLANT
STRAP TIBIA SHORT (MISCELLANEOUS) ×1 IMPLANT
SUCTION TUBE FRAZIER 10FR DISP (SUCTIONS) ×1 IMPLANT
SUT VIC AB 0 CT1 36 (SUTURE) ×1 IMPLANT
SUT VIC AB 1 CT1 36 (SUTURE) ×2 IMPLANT
SUT VIC AB 2-0 CT2 27 (SUTURE) ×1 IMPLANT
SYR 30ML LL (SYRINGE) ×2 IMPLANT
TIBIAL BASE ROT PLAT SZ 8 KNEE (Knees) ×1 IMPLANT
TIP FAN IRRIG PULSAVAC PLUS (DISPOSABLE) ×1 IMPLANT
TOWEL OR 17X26 4PK STRL BLUE (TOWEL DISPOSABLE) ×1 IMPLANT
TOWER CARTRIDGE SMART MIX (DISPOSABLE) ×1 IMPLANT
TRAP FLUID SMOKE EVACUATOR (MISCELLANEOUS) ×1 IMPLANT
TRAY FOLEY MTR SLVR 16FR STAT (SET/KITS/TRAYS/PACK) ×1 IMPLANT
WATER STERILE IRR 1000ML POUR (IV SOLUTION) ×1 IMPLANT
WRAPON POLAR PAD KNEE (MISCELLANEOUS) ×1

## 2023-03-04 NOTE — Discharge Summary (Signed)
 Physician Discharge Summary  Patient ID: Ernest Brown MRN: [id number] DOB/AGE: 07-21-1950 73 y.o.  Admit date: 03/04/2023 Discharge date: 03/05/2023  Admission Diagnoses:  History of total knee arthroplasty, right [Z96.651] Primary osteoarthritis of the right knee  Discharge Diagnoses: Patient Active Problem List   Diagnosis Date Noted   History of total knee arthroplasty, right 03/04/2023   Bursitis of hip 02/27/2023   Gastroesophageal reflux disease without esophagitis 08/09/2022   Personal history of other malignant neoplasm of skin 10/26/2018   Centrilobular emphysema (HCC) 09/26/2017   AAA (abdominal aortic aneurysm) without rupture (HCC) 02/03/2017   Essential hypertension 02/03/2017   Tobacco use disorder 02/03/2017   Adrenal adenoma, left 12/31/2016   Lung nodule 12/29/2016   Primary osteoarthritis of knees, bilateral 07/21/2016   Type 2 diabetes mellitus with circulatory disorder, without long-term current use of insulin  (HCC) 01/28/2014    Past Medical History:  Diagnosis Date   AAA (abdominal aortic aneurysm) (HCC)    Adrenal adenoma, left    Arrhythmia    Diabetes mellitus without complication (HCC)    GERD (gastroesophageal reflux disease)    Hypertension    Lung nodule    Osteoarthritis      Transfusion: None.   Consultants (if any):   Discharged Condition: Improved  Hospital Course: Ernest Brown is an 73 y.o. male who was admitted 03/04/2023 with a diagnosis of primary osteoarthritis of the right knee and went to the operating room on 03/04/2023 and underwent the above named procedures.    Surgeries: Procedure(s): COMPUTER ASSISTED TOTAL KNEE ARTHROPLASTY on 03/04/2023 Patient tolerated the surgery well. Taken to PACU where she was stabilized and then transferred to the orthopedic floor.  Started on aspirin  81mg  every 12 hrs. No evidence of DVT. Negative Homan. Physical therapy started on day #1 for gait training and transfer. OT started day #1 for  ADL and assisted devices.  Patient's IV and hemovac were removed on POD1.  Foley was removed shortly following surgery.  Implants: DePuy Attune size 7 posterior stabilized femoral component (cemented), size 8 rotating platform tibial component (cemented), 41 mm medialized dome patella (cemented), and a 6 mm stabilized rotating platform polyethylene insert.   He was given perioperative antibiotics:  Anti-infectives (From admission, onward)    Start     Dose/Rate Route Frequency Ordered Stop   03/04/23 2000  ceFAZolin  (ANCEF ) IVPB 2g/100 mL premix        2 g 200 mL/hr over 30 Minutes Intravenous Every 6 hours 03/04/23 1912 03/05/23 0213   03/04/23 0600  ceFAZolin  (ANCEF ) IVPB 2g/100 mL premix        2 g 200 mL/hr over 30 Minutes Intravenous On call to O.R. 03/03/23 2150 03/04/23 1156     .  He was given sequential compression devices, early ambulation, and Aspirin  for DVT prophylaxis.  He benefited maximally from the hospital stay and there were no complications.    Recent vital signs:  Vitals:   03/05/23 0334 03/05/23 0752  BP: (!) 159/72 (!) 143/92  Pulse: 87 98  Resp: 18 16  Temp:    SpO2: 96% 94%    Recent laboratory studies:  Lab Results  Component Value Date   HGB 15.7 02/21/2023   Lab Results  Component Value Date   WBC 6.3 02/21/2023   PLT 206 02/21/2023   No results found for: INR Lab Results  Component Value Date   NA 138 02/21/2023   K 4.3 02/21/2023   CL 101 02/21/2023   CO2  26 02/21/2023   BUN 20 02/21/2023   CREATININE 0.91 02/21/2023   GLUCOSE 114 (H) 02/21/2023    Discharge Medications:   Allergies as of 03/05/2023       Reactions   Codeine Rash        Medication List     TAKE these medications    amLODipine  5 MG tablet Commonly known as: NORVASC  Take 5 mg by mouth daily.   aspirin  EC 81 MG tablet Take 1 tablet (81 mg total) by mouth 2 (two) times daily. What changed: when to take this   celecoxib  200 MG capsule Commonly  known as: CELEBREX  Take 1 capsule (200 mg total) by mouth 2 (two) times daily.   cholecalciferol 25 MCG (1000 UNIT) tablet Commonly known as: VITAMIN D3 Take 1,000 Units by mouth daily.   cyanocobalamin 1000 MCG tablet Commonly known as: VITAMIN B12 Take 1,000 mcg by mouth daily.   finasteride  5 MG tablet Commonly known as: PROSCAR  Take 5 mg by mouth daily.   Jardiance  25 MG Tabs tablet Generic drug: empagliflozin  Take 25 mg by mouth daily.   metFORMIN  500 MG 24 hr tablet Commonly known as: GLUCOPHAGE -XR Take 500 mg by mouth 2 (two) times daily.   NYQUIL PO Take 1 capsule by mouth at bedtime as needed (sleep).   ondansetron  4 MG tablet Commonly known as: ZOFRAN  Take 1 tablet (4 mg total) by mouth every 6 (six) hours as needed for nausea.   oxyCODONE  5 MG immediate release tablet Commonly known as: Roxicodone  Take 1 tablet (5 mg total) by mouth every 4 (four) hours as needed for severe pain (pain score 7-10).   pantoprazole  40 MG tablet Commonly known as: PROTONIX  Take 40 mg by mouth daily as needed (acid reflux).   pravastatin  20 MG tablet Commonly known as: PRAVACHOL  Take 20 mg by mouth daily.   senna-docusate 8.6-50 MG tablet Commonly known as: Senokot-S Take 1 tablet by mouth 2 (two) times daily.   traMADol  50 MG tablet Commonly known as: Ultram  Take 1-2 tablets (50-100 mg total) by mouth every 4 (four) hours as needed for moderate pain (pain score 4-6).   VITAMIN C ER PO Take 1,000 mg by mouth daily.               Durable Medical Equipment  (From admission, onward)           Start     Ordered   03/04/23 2113  DME Walker rolling  Once       Question:  Patient needs a walker to treat with the following condition  Answer:  Total knee replacement status   03/04/23 2113   03/04/23 2113  DME Bedside commode  Once       Comments: Patient is not able to walk the distance required to go the bathroom, or he/she is unable to safely negotiate stairs  required to access the bathroom.  A 3in1 BSC will alleviate this problem  Question:  Patient needs a bedside commode to treat with the following condition  Answer:  Total knee replacement status   03/04/23 2113            Diagnostic Studies: DG Knee Right Port Result Date: 03/04/2023 CLINICAL DATA:  Status post right knee replacement EXAM: PORTABLE RIGHT KNEE - 2 VIEW COMPARISON:  None Available. FINDINGS: Right knee replacement is noted. Postoperative changes are seen. Surgical drains are noted. IMPRESSION: Status post right knee replacement.  No acute abnormality noted. Electronically Signed  By: Oneil Devonshire M.D.   On: 03/04/2023 17:16    Disposition: Plan for discharge home today pending progress with PT.     Follow-up Information     Drake Chew, PA-C Follow up in 14 day(s).   Specialty: Orthopedic Surgery Why: Staple Removal. Contact information: [address] [place] [address] [phone]                  Signed: Lynwood LITTIE Level PA-C 03/05/2023, 8:26 AM

## 2023-03-04 NOTE — Op Note (Signed)
 OPERATIVE NOTE  DATE OF SURGERY:  03/04/2023  PATIENT NAME:  Ernest Brown   DOB: 01/29/1951  MRN: [id number]  PRE-OPERATIVE DIAGNOSIS: Degenerative arthrosis of the right knee, primary  POST-OPERATIVE DIAGNOSIS:  Same  PROCEDURE:  Right total knee arthroplasty using computer-assisted navigation  SURGEON:  Lynwood SHAUNNA Mardee Mickey. M.D.  ASSISTANT:  Sidra Koyanagi, PA-C (present and scrubbed throughout the case, critical for assistance with exposure, retraction, instrumentation, and closure)  ANESTHESIA: spinal  ESTIMATED BLOOD LOSS: 50 mL  FLUIDS REPLACED: 600 mL of crystalloid  TOURNIQUET TIME: 86 minutes  DRAINS: 2 medium Hemovac drains  SOFT TISSUE RELEASES: Anterior cruciate ligament, posterior cruciate ligament, deep medial collateral ligament, patellofemoral ligament  IMPLANTS UTILIZED: DePuy Attune size 7 posterior stabilized femoral component (cemented), size 8 rotating platform tibial component (cemented), 41 mm medialized dome patella (cemented), and a 6 mm stabilized rotating platform polyethylene insert.  INDICATIONS FOR SURGERY: Ernest Brown is a 73 y.o. year old male with a long history of progressive knee pain. X-rays demonstrated severe degenerative changes in tricompartmental fashion. The patient had not seen any significant improvement despite conservative nonsurgical intervention. After discussion of the risks and benefits of surgical intervention, the patient expressed understanding of the risks benefits and agree with plans for total knee arthroplasty.   The risks, benefits, and alternatives were discussed at length including but not limited to the risks of infection, bleeding, nerve injury, stiffness, blood clots, the need for revision surgery, cardiopulmonary complications, among others, and they were willing to proceed.  PROCEDURE IN DETAIL: The patient was brought into the operating room and, after adequate spinal anesthesia was achieved, a tourniquet was  placed on the patient's upper thigh. The patient's knee and leg were cleaned and prepped with alcohol and DuraPrep and draped in the usual sterile fashion. A timeout was performed as per usual protocol. The lower extremity was exsanguinated using an Esmarch, and the tourniquet was inflated to 300 mmHg. An anterior longitudinal incision was made followed by a standard mid vastus approach. The deep fibers of the medial collateral ligament were elevated in a subperiosteal fashion off of the medial flare of the tibia so as to maintain a continuous soft tissue sleeve. The patella was subluxed laterally and the patellofemoral ligament was incised. Inspection of the knee demonstrated severe degenerative changes with full-thickness loss of articular cartilage. Osteophytes were debrided using a rongeur. Anterior and posterior cruciate ligaments were excised. Two 4.0 mm Schanz pins were inserted in the femur and into the tibia for attachment of the array of trackers used for computer-assisted navigation. Hip center was identified using a circumduction technique. Distal landmarks were mapped using the computer. The distal femur and proximal tibia were mapped using the computer. The distal femoral cutting guide was positioned using computer-assisted navigation so as to achieve a 5 distal valgus cut. The femur was sized and it was felt that a size 7 femoral component was appropriate. A size 7 femoral cutting guide was positioned and the anterior cut was performed and verified using the computer. This was followed by completion of the posterior and chamfer cuts. Femoral cutting guide for the central box was then positioned in the center box cut was performed.  Attention was then directed to the proximal tibia. Medial and lateral menisci were excised. The extramedullary tibial cutting guide was positioned using computer-assisted navigation so as to achieve a 0 varus-valgus alignment and 3 posterior slope. The cut was  performed and verified using the computer. The proximal tibia  was sized and it was felt that a size 8 tibial tray was appropriate. Tibial and femoral trials were inserted followed by insertion of a 6 mm polyethylene insert. This allowed for excellent mediolateral soft tissue balancing both in flexion and in full extension. Finally, the patella was cut and prepared so as to accommodate a 41 mm medialized dome patella. A patella trial was placed and the knee was placed through a range of motion with excellent patellar tracking appreciated. The femoral trial was removed after debridement of posterior osteophytes. The central post-hole for the tibial component was reamed followed by insertion of a keel punch. Tibial trials were then removed. Cut surfaces of bone were irrigated with copious amounts of normal saline using pulsatile lavage and then suctioned dry. Polymethylmethacrylate cement with gentamicin was prepared in the usual fashion using a vacuum mixer. Cement was applied to the cut surface of the proximal tibia as well as along the undersurface of a size 8 rotating platform tibial component. Tibial component was positioned and impacted into place. Excess cement was removed using Personal assistant. Cement was then applied to the cut surfaces of the femur as well as along the posterior flanges of the size 7 femoral component. The femoral component was positioned and impacted into place. Excess cement was removed using Personal assistant. A 6 mm polyethylene trial was inserted and the knee was brought into full extension with steady axial compression applied. Finally, cement was applied to the backside of a 41 mm medialized dome patella and the patellar component was positioned and patellar clamp applied. Excess cement was removed using Personal assistant. After adequate curing of the cement, the tourniquet was deflated after a total tourniquet time of 86 minutes. Hemostasis was achieved using electrocautery. The knee was  irrigated with copious amounts of normal saline using pulsatile lavage followed by 450 ml of Surgiphor and then suctioned dry. 20 mL of 1.3% Exparel  and 60 mL of 0.25% Marcaine  in 40 mL of normal saline was injected along the posterior capsule, medial and lateral gutters, and along the arthrotomy site. A 6 mm stabilized rotating platform polyethylene insert was inserted and the knee was placed through a range of motion with excellent mediolateral soft tissue balancing appreciated and excellent patellar tracking noted. 2 medium drains were placed in the wound bed and brought out through separate stab incisions. The medial parapatellar portion of the incision was reapproximated using interrupted sutures of #1 Vicryl. Subcutaneous tissue was approximated in layers using first #0 Vicryl followed #2-0 Vicryl. The skin was approximated with skin staples. A sterile dressing was applied.  The patient tolerated the procedure well and was transported to the recovery room in stable condition.    Ernest Brown, Jr., M.D.

## 2023-03-04 NOTE — Anesthesia Procedure Notes (Signed)
 Spinal  Patient location during procedure: OR Start time: 03/04/2023 11:37 AM End time: 03/04/2023 11:39 AM Reason for block: surgical anesthesia Staffing Performed: resident/CRNA  Anesthesiologist: Myra Lynwood MATSU, MD Resident/CRNA: Jaylene Nest, CRNA Performed by: Niki Manus SAUNDERS, CRNA Authorized by: Myra Lynwood MATSU, MD   Preanesthetic Checklist Completed: patient identified, IV checked, site marked, risks and benefits discussed, surgical consent, monitors and equipment checked, pre-op evaluation and timeout performed Spinal Block Patient position: sitting Prep: Betadine Patient monitoring: heart rate, continuous pulse ox, blood pressure and cardiac monitor Approach: midline Location: L4-5 Injection technique: single-shot Needle Needle type: Introducer and Pencan  Needle gauge: 24 G Needle length: 9 cm Assessment Events: CSF return Additional Notes Negative paresthesia. Negative blood return. Positive free-flowing CSF. Expiration date of kit checked and confirmed. Patient tolerated procedure well, without complications.

## 2023-03-04 NOTE — Anesthesia Preprocedure Evaluation (Signed)
 Anesthesia Evaluation  Patient identified by MRN, date of birth, ID band Patient awake    Reviewed: Allergy & Precautions, H&P , NPO status , Patient's Chart, lab work & pertinent test results, reviewed documented beta blocker date and time   Airway Mallampati: II   Neck ROM: full    Dental  (+) Poor Dentition   Pulmonary COPD, former smoker   Pulmonary exam normal        Cardiovascular Exercise Tolerance: Good hypertension, On Medications negative cardio ROS Normal cardiovascular exam Rhythm:regular Rate:Normal     Neuro/Psych negative neurological ROS  negative psych ROS   GI/Hepatic Neg liver ROS,GERD  Medicated,,  Endo/Other  negative endocrine ROSdiabetes, Well Controlled, Type 2, Oral Hypoglycemic Agents    Renal/GU negative Renal ROS  negative genitourinary   Musculoskeletal   Abdominal   Peds  Hematology negative hematology ROS (+)   Anesthesia Other Findings Past Medical History: No date: AAA (abdominal aortic aneurysm) (HCC) No date: Adrenal adenoma, left No date: Arrhythmia No date: Diabetes mellitus without complication (HCC) No date: GERD (gastroesophageal reflux disease) No date: Hypertension No date: Lung nodule No date: Osteoarthritis Past Surgical History: No date: FOOT SURGERY; Bilateral No date: KNEE ARTHROSCOPY No date: SIGMOIDECTOMY No date: TOOTH EXTRACTION BMI    Body Mass Index: 26.10 kg/m     Reproductive/Obstetrics negative OB ROS                             Anesthesia Physical Anesthesia Plan  ASA: 3  Anesthesia Plan: Spinal   Post-op Pain Management:    Induction:   PONV Risk Score and Plan: 3  Airway Management Planned:   Additional Equipment:   Intra-op Plan:   Post-operative Plan:   Informed Consent: I have reviewed the patients History and Physical, chart, labs and discussed the procedure including the risks, benefits and  alternatives for the proposed anesthesia with the patient or authorized representative who has indicated his/her understanding and acceptance.     Dental Advisory Given  Plan Discussed with: CRNA  Anesthesia Plan Comments:        Anesthesia Quick Evaluation

## 2023-03-04 NOTE — H&P (Signed)
 ORTHOPAEDIC HISTORY & PHYSICAL Ernest Thom Ade, PA - 03/01/2023 9:15 AM EST Formatting of this note is different from the original. Images from the original note were not included. Chief Complaint Chief Complaint Patient presents with Knee Pain H & P RIGHT KNEE  Reason for Visit Ernest Brown is a 73 y.o. who presents today for history and physical. He is to undergo a right total knee arthroplasty on 03/04/2023. Since his last visit at the clinic there have been no improvement in his condition. Patient expresses his desire to proceed with surgery.  He reports a several year history of bilateral knee pain with the right knee more symptomatic. He localizes most of the pain along the medial aspect of the knees. He reports some swelling, no locking, and some giving way of the knees. The pain is aggravated by any weight bearing. The knee pain limits the patient's ability to ambulate long distances. The patient has not appreciated any significant improvement despite topical analgesics, activity modification, and intraarticular corticosteroid injections. He is not using any ambulatory aids. The patient states that the knee pain has progressed to the point that it is significantly interfering with his activities of daily living.  Patient also experiencing quite a bit of discomfort to the left one and would like to have surgery on that when just as soon as you can add to the right knee is completed.  Past Medical History Past Medical History: Diagnosis Date Arrhythmia Diabetes mellitus without complication (CMS/HHS-HCC) Gastroesophageal reflux disease without esophagitis 08/09/2022 Osteoarthritis Tobacco use  Past Surgical History Past Surgical History: Procedure Laterality Date FLEXIBLE SIGMOIDOSCOPY 09/15/1995 Colon Polyp @ 20cm COLONOSCOPY 10/21/1995 Hyperplastic Polyps FLEXIBLE SIGMOIDOSCOPY 03/07/1998 Colon Polyp @ 5cm COLONOSCOPY 12/19/2001 Hyperplastic Polyps COLONOSCOPY  04/02/2010 Dr. FABIENE Holmes @ [facility] - Adenomatous Polyp: Recall 02/18/15 COLONOSCOPY 12/08/2020 INTERNAL HEMORRHOIDS, CBF 2032 foot surgery Bilateral rare issue with knots in the arch KNEE ARTHROSCOPY  Past Family History Family History Problem Relation Age of Onset Diabetes type II Mother Deceased Myocardial Infarction (Heart attack) Father No Known Problems Sister No Known Problems Brother No Known Problems Maternal Grandmother No Known Problems Maternal Grandfather No Known Problems Paternal Grandmother No Known Problems Paternal Grandfather No Known Problems Sister No Known Problems Son No Known Problems Daughter Myocardial Infarction (Heart attack) Paternal Uncle Myocardial Infarction (Heart attack) Paternal Aunt  Medications Current Outpatient Medications Medication Sig Dispense Refill amLODIPine  (NORVASC ) 5 MG tablet TAKE 1 TABLET(5 MG) BY MOUTH DAILY 90 tablet 1 ascorbic acid, vitamin C, (VITAMIN C) 1,000 mg TbER Take 1,000 mg by mouth once daily ascorbic acid, vitamin C, (VITAMIN C) 1000 MG tablet Take 1,000 mg by mouth once daily aspirin  81 MG EC tablet Take 81 mg by mouth once daily cholecalciferol (VITAMIN D3) 1000 unit tablet Take 1,000 Units by mouth once daily cyanocobalamin (VITAMIN B12) 1000 MCG tablet Take 1,000 mcg by mouth once daily. DM/p-ephed/acetaminoph/doxylam (NYQUIL D ORAL) Take 1 capsule by mouth at bedtime as needed ((sleep).) DULoxetine (CYMBALTA) 60 MG DR capsule Take 1 capsule (60 mg total) by mouth once daily 30 capsule 11 empagliflozin  (JARDIANCE ) 25 mg tablet Take 1 tablet (25 mg total) by mouth once daily 90 tablet 3 finasteride  (PROSCAR ) 5 mg tablet Take 5 mg by mouth once daily losartan (COZAAR) 100 MG tablet Take 1 tablet (100 mg total) by mouth once daily 90 tablet 3 metFORMIN  (GLUCOPHAGE -XR) 500 MG XR tablet Take 1 tablet (500 mg total) by mouth 2 (two) times daily 60 tablet 11 pantoprazole  (PROTONIX ) 40 MG DR  tablet Take 1 tablet (40 mg  total) by mouth once daily (Patient taking differently: Take 40 mg by mouth once daily as needed (ACID REFLUX)) 90 tablet 3 pravastatin  (PRAVACHOL ) 20 MG tablet Take 1 tablet (20 mg total) by mouth at bedtime 90 tablet 1  No current facility-administered medications for this visit.  Allergies Allergies Allergen Reactions Codeine Nausea and Rash   Review of Systems A comprehensive 14 point ROS was performed, reviewed, and the pertinent orthopaedic findings are documented in the HPI.  Exam BP (!) 140/90 (BP Location: Left upper arm, Patient Position: Sitting, BP Cuff Size: Adult)  Ht 182.9 cm (6')  Wt 91.4 kg (201 lb 6.4 oz)  BMI 27.31 kg/m  General: Well-developed well-nourished male seen in no acute distress.  HEENT: Atraumatic,normocephalic. Pupils are equal and reactive to light. Oropharynx is clear with moist mucosa  Lungs: Clear to auscultation bilaterally  Cardiovascular: Regular rate and rhythm. Normal S1, S2. No murmurs. No appreciable gallops or rubs. Peripheral pulses are palpable.  Abdomen: Soft, non-tender, nondistended. Bowel sounds present  Extremity: Right Knee: Soft tissue swelling: minimal Effusion: none Erythema: none Crepitance: mild Tenderness: medial Alignment: relative varus Mediolateral laxity: medial pseudolaxity Posterior sag: negative Patellar tracking: Good tracking without evidence of subluxation or tilt Atrophy: No significant atrophy. Quadriceps tone was good. Range of motion: 0/0/127 degrees   Neurological:  The patient is alert and oriented Sensation to light touch appears to be intact and within normal limits Gross motor strength appeared to be equal to 5/5  Vascular :  Peripheral pulses felt to be palpable. Capillary refill appears to be intact and within normal limits  X-ray  1. AP standing, lateral and sunrise view of the right knee ordered and interpreted on today's visit shows significant narrowing of the medial  cartilage space with associated varus alignment. He is noted to be bone-on-bone. Osteophyte formation as well as subchondral sclerosis is noted. There is no evidence of any fractures or dislocation. The patella is tracking well.  Impression  1. Degenerative arthrosis right knee  Plan  1. Have gone over the patient's medication on today's visit 2. Past medical history reviewed 3. Postop rehab course discussed. Patient would like to use ice packs at home versus the Polar Care. He states he has enough to circulate these around-the-clock. 4. Return to clinic postop as scheduled. Sooner if any problems  This note was generated in part with voice recognition software and I apologize for any typographical errors that were not detected and corrected   Thom JONELLE Geralds PA Electronically signed by Geralds Thom Ade, PA at 03/01/2023 11:37 AM EST

## 2023-03-04 NOTE — Transfer of Care (Signed)
 Immediate Anesthesia Transfer of Care Note  Patient: Ernest Brown  Procedure(s) Performed: COMPUTER ASSISTED TOTAL KNEE ARTHROPLASTY (Right: Knee)  Patient Location: PACU  Anesthesia Type:Spinal  Level of Consciousness: drowsy  Airway & Oxygen Therapy: Patient Spontanous Breathing and Patient connected to face mask oxygen  Post-op Assessment: Report given to RN and Post -op Vital signs reviewed and stable  Post vital signs: Reviewed and stable  Last Vitals:  Vitals Value Taken Time  BP 101/61   Temp    Pulse 74 03/04/23 1523  Resp 12   SpO2 95 % 03/04/23 1523  Vitals shown include unfiled device data.  Last Pain:  Vitals:   03/04/23 0940  TempSrc: Temporal  PainSc: 0-No pain         Complications: No notable events documented.

## 2023-03-04 NOTE — Progress Notes (Signed)
 Patient is not able to walk the distance required to go the bathroom, or he/she is unable to safely negotiate stairs required to access the bathroom.  A 3in1 BSC will alleviate this problem   Amenda Duclos P. Angie Fava M.D.

## 2023-03-04 NOTE — Progress Notes (Signed)
 PT Cancellation Note  Patient Details Name: Ernest Brown MRN: [id number] DOB: [birth year]   Cancelled Treatment:    Reason Eval/Treat Not Completed: Patient not medically ready: Per nursing pt does not yet have sufficient return of LE strength/sensation to safely participate with PT services.  Will attempt to see pt at a future date/time as medically appropriate.    CHARM [name] PT, DPT 03/04/23, 4:20 PM

## 2023-03-04 NOTE — Interval H&P Note (Signed)
 History and Physical Interval Note:  03/04/2023 11:01 AM  Ernest Brown  has presented today for surgery, with the diagnosis of PRIMARY OSTEOARTHRITIS OF RIGHT KNEE..  The various methods of treatment have been discussed with the patient and family. After consideration of risks, benefits and other options for treatment, the patient has consented to  Procedure(s): COMPUTER ASSISTED TOTAL KNEE ARTHROPLASTY (Right) as a surgical intervention.  The patient's history has been reviewed, patient examined, no change in status, stable for surgery.  I have reviewed the patient's chart and labs.  Questions were answered to the patient's satisfaction.     Jahzion Brogden P Aizen Duval

## 2023-03-05 DIAGNOSIS — M1711 Unilateral primary osteoarthritis, right knee: Secondary | ICD-10-CM | POA: Diagnosis not present

## 2023-03-05 LAB — GLUCOSE, CAPILLARY: Glucose-Capillary: 178 mg/dL — ABNORMAL HIGH (ref 70–99)

## 2023-03-05 MED ORDER — ASPIRIN 81 MG PO TBEC: 81.0000 mg | DELAYED_RELEASE_TABLET | Freq: Two times a day (BID) | ORAL | 0 refills | Status: AC

## 2023-03-05 MED ORDER — TRAMADOL HCL 50 MG PO TABS: 50.0000 mg | ORAL_TABLET | ORAL | 0 refills | Status: DC | PRN

## 2023-03-05 MED ORDER — SENNOSIDES-DOCUSATE SODIUM 8.6-50 MG PO TABS: 1.0000 | ORAL_TABLET | Freq: Two times a day (BID) | ORAL | 0 refills | Status: DC

## 2023-03-05 MED ORDER — ONDANSETRON HCL 4 MG PO TABS: 4.0000 mg | ORAL_TABLET | Freq: Four times a day (QID) | ORAL | 0 refills | Status: DC | PRN

## 2023-03-05 MED ORDER — OXYCODONE HCL 5 MG PO TABS: 5.0000 mg | ORAL_TABLET | ORAL | 0 refills | Status: DC | PRN

## 2023-03-05 MED ORDER — CELECOXIB 200 MG PO CAPS: 200.0000 mg | ORAL_CAPSULE | Freq: Two times a day (BID) | ORAL | Status: DC

## 2023-03-05 NOTE — TOC Transition Note (Signed)
 Transition of Care [facility]) - Discharge Note   Patient Details  Name: Ernest Brown MRN: [id number] Date of Birth: [birth year]  [facility] CM/SW Contact:  Racheal LITTIE Schimke, RN Phone Number: 03/05/2023, 4:06 PM   Clinical Narrative:  Discharged home with wife.     Discharge home with Home Health PT. Barriers to Discharge: Barriers Resolved     Patient and family notified of of transfer: 03/05/23  Discharge Plan and Services Additional resources added to the After Visit Summary for                  DME Arranged: Walker rolling DME Agency: [employer] Date DME Agency Contacted: 03/05/23   Representative spoke with at DME Agency: [employer], to be delivered to the home, patient unable to wait..  HH arranged with [facility]  Social Drivers of Health (SDOH) Interventions SDOH Screenings   Food Insecurity: No Food Insecurity (03/04/2023)  Housing: Low Risk  (03/04/2023)  Transportation Needs: No Transportation Needs (03/04/2023)  Utilities: Not At Risk (03/04/2023)  Depression (PHQ2-9): Low Risk  (05/07/2019)  Financial Resource Strain: Low Risk  (10/14/2022)   Received from [facility]  Social Connections: Moderately Isolated (03/04/2023)  Tobacco Use: Medium Risk (03/04/2023)     Readmission Risk Interventions     No data to display

## 2023-03-05 NOTE — Evaluation (Signed)
 Occupational Therapy Evaluation Patient Details Name: Ernest Brown MRN: [id number] DOB: 1950/12/23 Today's Date: 03/05/2023   History of Present Illness Ernest Brown is a 73 y.o. admitted for worsening knee pain and underwent a right total knee arthroplasty on 03/04/2023.  Pt is WBAT on the right.   Clinical Impression   Pt seen for Occupational Therapy evaluation this date.  Pt lives at home with his wife who is able to assist with care as needed.  Pt was able to perform transfers with supervision from bed, chair, and toilet.  Pt presents with mild decreased ability to perform basic self care tasks (lower body bathing and occasional assist with socks and pants).  Pt with decreased ROM of knee, supervision recommended for balance with functional transfers and mobility.  Instructed pt on lower body dressing techniques in session.  Pt has a reacher at home if needed.  Pt with no pain reported this date.  Pt can benefit from skilled OT services during stay to increase independence in necessary daily tasks and will not likely require follow up OT services at discharge, his wife reports she will assist as needed.         If plan is discharge home, recommend the following: A little help with walking and/or transfers;Assistance with cooking/housework;A little help with bathing/dressing/bathroom    Functional Status Assessment  Patient has had a recent decline in their functional status and demonstrates the ability to make significant improvements in function in a reasonable and predictable amount of time.  Equipment Recommendations  None recommended by OT    Recommendations for Other Services       Precautions / Restrictions Precautions Precautions: Knee Precaution Booklet Issued: Yes (comment) Precaution Comments: Reviewed positionining, and provided TKA handout. Restrictions Weight Bearing Restrictions Per Provider Order: Yes RLE Weight Bearing Per Provider Order: Weight bearing as  tolerated      Mobility Bed Mobility Overal bed mobility: Modified Independent                  Transfers Overall transfer level: Needs assistance Equipment used: Rolling walker (2 wheels) Transfers: Sit to/from Stand Sit to Stand: Supervision           General transfer comment: able to stand from EOB with RW, supervision for safety.      Balance Overall balance assessment: Needs assistance Sitting-balance support: Feet supported, No upper extremity supported Sitting balance-Leahy Scale: Normal     Standing balance support: Bilateral upper extremity supported, Reliant on assistive device for balance, During functional activity                               ADL either performed or assessed with clinical judgement   ADL Overall ADL's : Needs assistance/impaired Eating/Feeding: Independent   Grooming: Independent   Upper Body Bathing: Independent   Lower Body Bathing: Minimal assistance   Upper Body Dressing : Independent   Lower Body Dressing: Modified independent   Toilet Transfer: Supervision/safety   Toileting- Clothing Manipulation and Hygiene: Supervision/safety       Functional mobility during ADLs: Supervision/safety General ADL Comments: Pt able to don and doff socks with effort, supervision for toileting     Vision         Perception         Praxis         Pertinent Vitals/Pain Pain Assessment Pain Assessment: No/denies pain     Extremity/Trunk Assessment Upper Extremity  Assessment Upper Extremity Assessment: Overall WFL for tasks assessed   Lower Extremity Assessment Lower Extremity Assessment: Defer to PT evaluation RLE Deficits / Details: generalized weakness on RLE, able to complete SLR RLE Sensation: WNL   Cervical / Trunk Assessment Cervical / Trunk Assessment: Normal   Communication Communication Communication: No apparent difficulties   Cognition Arousal: Alert Behavior During Therapy: WFL for  tasks assessed/performed Overall Cognitive Status: Within Functional Limits for tasks assessed                                       General Comments   Pt seen for instruction on lower body dressing techniques without the use of adaptive equipment.  Pt has a reacher at home if needed.    Exercises     Shoulder Instructions      Home Living Family/patient expects to be discharged to:: Private residence Living Arrangements: Spouse/significant other Available Help at Discharge: Family Type of Home: House Home Access: Stairs to enter Secretary/administrator of Steps: 3 Entrance Stairs-Rails: Right;Left Home Layout: One level     Bathroom Shower/Tub: Producer, Television/film/video: Handicapped height Bathroom Accessibility: Yes   Home Equipment: BSC/3in1          Prior Functioning/Environment Prior Level of Function : Independent/Modified Independent             Mobility Comments: prior to admission pt ambulated without an assistive device ADLs Comments: Pt was independent with self care, driving and household tasks.        OT Problem List: Decreased knowledge of use of DME or AE      OT Treatment/Interventions:      OT Goals(Current goals can be found in the care plan section) Acute Rehab OT Goals Patient Stated Goal: Pt would like to return home with his spouse and be independent with all tasks. OT Goal Formulation: With patient/family Time For Goal Achievement: 03/19/23 Potential to Achieve Goals: Good ADL Goals Pt Will Perform Lower Body Dressing: with supervision Pt Will Transfer to Toilet: with supervision  OT Frequency:      Co-evaluation              AM-PAC OT 6 Clicks Daily Activity     Outcome Measure Help from another person eating meals?: None Help from another person taking care of personal grooming?: None Help from another person toileting, which includes using toliet, bedpan, or urinal?: None Help from another  person bathing (including washing, rinsing, drying)?: A Little Help from another person to put on and taking off regular upper body clothing?: None Help from another person to put on and taking off regular lower body clothing?: A Little 6 Click Score: 22   End of Session Equipment Utilized During Treatment: Gait belt;Rolling walker (2 wheels)  Activity Tolerance: Patient tolerated treatment well Patient left: Other (comment) (Pt present)  OT Visit Diagnosis: Unsteadiness on feet (R26.81)                Time: 9089-9059 OT Time Calculation (min): 30 min Charges:  OT General Charges $OT Visit: 1 Visit OT Evaluation $OT Eval Low Complexity: 1 Low OT Treatments $Self Care/Home Management : 8-22 mins Kaylie Ritter T Mieczyslaw Stamas, OTR/L, CLT 03/05/2023, 11:24 AM

## 2023-03-05 NOTE — Progress Notes (Signed)
 Physical Therapy Evaluation Patient Details Name: Ernest Brown MRN: [id number] DOB: 09/02/1950 Today's Date: 03/05/2023  History of Present Illness  Ernest Brown is a 73 y.o. admitted for worsening knee pain and underwent a right total knee arthroplasty on 03/04/2023.  Pt is WBAT on the right.   Clinical Impression  Patient actively working with OT at time of arrival, agreeable to PT evaluation with hand off completed. Prior to admission, patient was IND with mobility and ADLs with no AD. Lives in single level home with spouse, 3 STE with bilateral rails.   On evaluation, patient was close supv with STS transfers, ambulation approx 30 ft with RW, and stair negotiation with rails. No pain with mobility attempts this date, mild fatigue. Patient's goniometric ROM for R Knee was -5 to 92. Patient demo ability to safety mobilize around home and enter/exit home with use of AD and supervision for family. Reviewed TKA precautions, positioning and exercise handout with patient/spouse. Patient able to demonstrate and verbalize understanding. Patient will benefit from skilled acute PT services to address functional impairments (see below for additional) and maximize functional mobility. Anticipate the need for follow up PT services upon acute hospital discharge. Will continue to follow acutely.          If plan is discharge home, recommend the following: A little help with walking and/or transfers;A little help with bathing/dressing/bathroom;Assist for transportation;Help with stairs or ramp for entrance   Can travel by private vehicle        Equipment Recommendations Rolling walker (2 wheels)  Recommendations for Other Services       Functional Status Assessment Patient has had a recent decline in their functional status and demonstrates the ability to make significant improvements in function in a reasonable and predictable amount of time.     Precautions / Restrictions  Precautions Precautions: Knee Precaution Booklet Issued: Yes (comment) Precaution Comments: Reviewed positionining, and provided TKA handout. Restrictions Weight Bearing Restrictions Per Provider Order: Yes RLE Weight Bearing Per Provider Order: Weight bearing as tolerated      Mobility  Bed Mobility               General bed mobility comments: Not observed; recieved sitting EOB and left in recliner at end of session.    Transfers Overall transfer level: Needs assistance Equipment used: Rolling walker (2 wheels) Transfers: Sit to/from Stand Sit to Stand: Supervision           General transfer comment: able to stand from EOB with RW, supervision for safety.    Ambulation/Gait Ambulation/Gait assistance: Supervision Gait Distance (Feet): 30 Feet Assistive device: Rolling walker (2 wheels)   Gait velocity: Decreased     General Gait Details: antalgic gait pattern, shortened step length noted, increased reliance on RW. Able to ambulate 30 ft with supervision, no overt LOB noted.  Stairs Stairs: Yes Stairs assistance: Supervision Stair Management: One rail Right, Step to pattern, Forwards Number of Stairs: 4 General stair comments: completed stair negotiation with single rail, PT educating on sequencing pattern. Pt able to complete returning to demo understanding and negotiate 4 steps with single rail with supervision to demo safe entry/exit in home.  Wheelchair Mobility     Tilt Bed    Modified Rankin (Stroke Patients Only)       Balance Overall balance assessment: Needs assistance Sitting-balance support: Feet supported, No upper extremity supported Sitting balance-Leahy Scale: Normal     Standing balance support: Bilateral upper extremity supported, Reliant on assistive device  for balance, During functional activity Standing balance-Leahy Scale: Good Standing balance comment: no overt LOB; good balance noted with use of RW                              Pertinent Vitals/Pain Pain Assessment Pain Assessment: No/denies pain    Home Living Family/patient expects to be discharged to:: Private residence Living Arrangements: Spouse/significant other Available Help at Discharge: Family Type of Home: House Home Access: Stairs to enter Entrance Stairs-Rails: Doctor, General Practice of Steps: 3   Home Layout: One level Home Equipment: BSC/3in1      Prior Function Prior Level of Function : Independent/Modified Independent             Mobility Comments: prior to admission pt ambulated without an assistive device ADLs Comments: Pt was independent with self care, driving and household tasks.     Extremity/Trunk Assessment   Upper Extremity Assessment Upper Extremity Assessment: Overall WFL for tasks assessed    Lower Extremity Assessment Lower Extremity Assessment: Defer to PT evaluation RLE Deficits / Details: generalized weakness on RLE, able to complete SLR RLE Sensation: WNL    Cervical / Trunk Assessment Cervical / Trunk Assessment: Normal  Communication   Communication Communication: No apparent difficulties  Cognition Arousal: Alert Behavior During Therapy: Impulsive Overall Cognitive Status: Within Functional Limits for tasks assessed                                          General Comments      Exercises Total Joint Exercises Ankle Circles/Pumps: AROM, Strengthening, Right, 5 reps, Seated Straight Leg Raises: AROM, Strengthening, Right, 10 reps, Supine Long Arc Quad: AROM, Right, 5 reps, Seated Goniometric ROM: Ext: -5, Flexion 92 Other Exercises Other Exercises: Reviewed TKA handout with exercises and positioning with patient. Demo and verbalized understanding.   Assessment/Plan    PT Assessment Patient needs continued PT services  PT Problem List Decreased strength;Decreased range of motion;Decreased activity tolerance;Decreased balance;Decreased mobility        PT Treatment Interventions DME instruction;Gait training;Stair training;Functional mobility training;Therapeutic activities;Therapeutic exercise;Balance training;Neuromuscular re-education    PT Goals (Current goals can be found in the Care Plan section)  Acute Rehab PT Goals Patient Stated Goal: Get home as soon as possible. PT Goal Formulation: With patient Time For Goal Achievement: 03/19/23 Potential to Achieve Goals: Good    Frequency BID     Co-evaluation               AM-PAC PT 6 Clicks Mobility  Outcome Measure Help needed turning from your back to your side while in a flat bed without using bedrails?: None Help needed moving from lying on your back to sitting on the side of a flat bed without using bedrails?: None Help needed moving to and from a bed to a chair (including a wheelchair)?: A Little Help needed standing up from a chair using your arms (e.g., wheelchair or bedside chair)?: A Little Help needed to walk in hospital room?: A Little Help needed climbing 3-5 steps with a railing? : A Little 6 Click Score: 20    End of Session Equipment Utilized During Treatment: Gait belt Activity Tolerance: Patient tolerated treatment well Patient left: in chair;with call bell/phone within reach;with family/visitor present Nurse Communication: Mobility status PT Visit Diagnosis: Unsteadiness on feet (R26.81);Other abnormalities of  gait and mobility (R26.89);Muscle weakness (generalized) (M62.81);Pain Pain - Right/Left: Right Pain - part of body: Knee    Time: 0931-1000 PT Time Calculation (min) (ACUTE ONLY): 29 min   Charges:   PT Evaluation $PT Eval Low Complexity: 1 Low PT Treatments $Therapeutic Exercise: 8-22 mins PT General Charges $$ ACUTE PT VISIT: 1 Visit         Carolyn HERO Fairly, PT, DPT 03/05/23 11:12 AM

## 2023-03-05 NOTE — Progress Notes (Signed)
  Subjective: 1 Day Post-Op Procedure(s) (LRB): COMPUTER ASSISTED TOTAL KNEE ARTHROPLASTY (Right) Patient reports pain as mild.   Patient is well, and has had no acute complaints or problems Plan is to go Home after hospital stay. Negative for chest pain and shortness of breath Fever: no Gastrointestinal:Negative for nausea and vomiting  Objective: Vital signs in last 24 hours: Temp:  [97.1 F (36.2 C)-98.5 F (36.9 C)] 97.5 F (36.4 C) (01/04 0027) Pulse Rate:  [69-98] 98 (01/04 0752) Resp:  [10-18] 16 (01/04 0752) BP: (101-159)/(61-92) 143/92 (01/04 0752) SpO2:  [92 %-98 %] 94 % (01/04 0752) Weight:  [92.2 kg] 92.2 kg (01/03 0940)  Intake/Output from previous day:  Intake/Output Summary (Last 24 hours) at 03/05/2023 0823 Last data filed at 03/05/2023 0700 Gross per 24 hour  Intake 1640 ml  Output 2750 ml  Net -1110 ml    Intake/Output this shift: No intake/output data recorded.  Labs: No results for input(s): HGB in the last 72 hours. No results for input(s): WBC, RBC, HCT, PLT in the last 72 hours. No results for input(s): NA, K, CL, CO2, BUN, CREATININE, GLUCOSE, CALCIUM in the last 72 hours. No results for input(s): LABPT, INR in the last 72 hours.   EXAM General - Patient is Alert, Appropriate, and Oriented Extremity - ABD soft Neurovascular intact Dorsiflexion/Plantar flexion intact Incision: dressing C/D/I No cellulitis present Compartment soft Dressing/Incision - Bulky dressing removed this AM.  No drainage noted to aquacell.  Hemovac removed without issue, 4x4 with tegaderm applied. Motor Function - intact, moving foot and toes well on exam.  Abdomen soft to palpation with intact bowel sounds. Negative Homans Able to perform straight leg raise without issue.  Past Medical History:  Diagnosis Date   AAA (abdominal aortic aneurysm) (HCC)    Adrenal adenoma, left    Arrhythmia    Diabetes mellitus without complication (HCC)     GERD (gastroesophageal reflux disease)    Hypertension    Lung nodule    Osteoarthritis     Assessment/Plan: 1 Day Post-Op Procedure(s) (LRB): COMPUTER ASSISTED TOTAL KNEE ARTHROPLASTY (Right) Principal Problem:   History of total knee arthroplasty, right  Estimated body mass index is 26.1 kg/m as calculated from the following:   Height as of this encounter: 6' 2 (1.88 m).   Weight as of this encounter: 92.2 kg. Advance diet Up with therapy D/C IV fluids when tolerating po intake.  Vitals reviewed this AM. Hemovac removed this AM without issue.  4x4 with tegaderm applied. Up with therapy today. Patient required in and out cath last night, he is now urinating without issue. Passing gas. Plan for discharge home today pending progress with PT.  DVT Prophylaxis - Aspirin  and TED hose Weight-Bearing as tolerated to right leg  J. Gustavo Level, PA-C [facility] 03/05/2023, 8:23 AM

## 2023-03-05 NOTE — Plan of Care (Signed)
  Problem: Education: Goal: Knowledge of General Education information will improve Description: Including pain rating scale, medication(s)/side effects and non-pharmacologic comfort measures Outcome: Progressing   Problem: Health Behavior/Discharge Planning: Goal: Ability to manage health-related needs will improve Outcome: Progressing   Problem: Clinical Measurements: Goal: Ability to maintain clinical measurements within normal limits will improve Outcome: Progressing Goal: Will remain free from infection Outcome: Progressing Goal: Diagnostic test results will improve Outcome: Progressing Goal: Respiratory complications will improve Outcome: Progressing Goal: Cardiovascular complication will be avoided Outcome: Progressing   Problem: Activity: Goal: Risk for activity intolerance will decrease Outcome: Progressing   Problem: Nutrition: Goal: Adequate nutrition will be maintained Outcome: Progressing   Problem: Coping: Goal: Level of anxiety will decrease Outcome: Progressing   Problem: Elimination: Goal: Will not experience complications related to bowel motility Outcome: Progressing Goal: Will not experience complications related to urinary retention Outcome: Progressing   Problem: Pain Management: Goal: General experience of comfort will improve Outcome: Progressing   Problem: Safety: Goal: Ability to remain free from injury will improve Outcome: Progressing   Problem: Skin Integrity: Goal: Risk for impaired skin integrity will decrease Outcome: Progressing   Problem: Education: Goal: Ability to describe self-care measures that may prevent or decrease complications (Diabetes Survival Skills Education) will improve Outcome: Progressing Goal: Individualized Educational Video(s) Outcome: Progressing   Problem: Coping: Goal: Ability to adjust to condition or change in health will improve Outcome: Progressing   Problem: Fluid Volume: Goal: Ability to  maintain a balanced intake and output will improve Outcome: Progressing   Problem: Health Behavior/Discharge Planning: Goal: Ability to identify and utilize available resources and services will improve Outcome: Progressing Goal: Ability to manage health-related needs will improve Outcome: Progressing   Problem: Metabolic: Goal: Ability to maintain appropriate glucose levels will improve Outcome: Progressing   Problem: Nutritional: Goal: Maintenance of adequate nutrition will improve Outcome: Progressing Goal: Progress toward achieving an optimal weight will improve Outcome: Progressing   Problem: Skin Integrity: Goal: Risk for impaired skin integrity will decrease Outcome: Progressing   Problem: Tissue Perfusion: Goal: Adequacy of tissue perfusion will improve Outcome: Progressing   Problem: Education: Goal: Knowledge of the prescribed therapeutic regimen will improve Outcome: Progressing Goal: Individualized Educational Video(s) Outcome: Progressing   Problem: Activity: Goal: Ability to avoid complications of mobility impairment will improve Outcome: Progressing Goal: Range of joint motion will improve Outcome: Progressing   Problem: Clinical Measurements: Goal: Postoperative complications will be avoided or minimized Outcome: Progressing   Problem: Pain Management: Goal: Pain level will decrease with appropriate interventions Outcome: Progressing   Problem: Skin Integrity: Goal: Will show signs of wound healing Outcome: Progressing

## 2023-03-05 NOTE — Anesthesia Postprocedure Evaluation (Signed)
 Anesthesia Post Note  Patient: Ernest Brown  Procedure(s) Performed: COMPUTER ASSISTED TOTAL KNEE ARTHROPLASTY (Right: Knee)  Patient location during evaluation: Nursing Unit Anesthesia Type: Spinal Level of consciousness: oriented and awake and alert Pain management: pain level controlled Vital Signs Assessment: post-procedure vital signs reviewed and stable Respiratory status: spontaneous breathing and respiratory function stable Cardiovascular status: blood pressure returned to baseline and stable Postop Assessment: no headache, no backache, no apparent nausea or vomiting and patient able to bend at knees Anesthetic complications: no   No notable events documented.   Last Vitals:  Vitals:   03/05/23 0027 03/05/23 0334  BP: 125/69 (!) 159/72  Pulse: 73 87  Resp: 18 18  Temp: (!) 36.4 C   SpO2: 93% 96%    Last Pain:  Vitals:   03/04/23 2241  TempSrc:   PainSc: 3                  Camellia Merilee Louder

## 2023-03-05 NOTE — TOC Progression Note (Signed)
 Transition of Care [facility]) - Progression Note    Patient Details  Name: Ernest Brown MRN: [id number] Date of Birth: [birth year]  Transition of Care [facility]) CM/SW Contact  Racheal LITTIE Schimke, RN Phone Number: 03/05/2023, 3:43 PM  Clinical Narrative:  To discharge home with RW per PT recommendation, patient unable to wait, RW to be delivered to the home. [facility] will provide HHPT services per wife, they did receive a call and [facility] will come tomorrow, 03/06/23.      Barriers to Discharge: Barriers Resolved  Expected Discharge Plan and Services         Expected Discharge Date: 03/05/23               DME Arranged: Vannie rolling DME Agency: [facility] Date DME Agency Contacted: 03/05/23   Representative spoke with at DME Agency: [employer], to be delivered to the home, patient unable to wait.[facility] HH Arranged: NA HH Agency: NA         Social Determinants of Health (SDOH) Interventions SDOH Screenings   Food Insecurity: No Food Insecurity (03/04/2023)  Housing: Low Risk  (03/04/2023)  Transportation Needs: No Transportation Needs (03/04/2023)  Utilities: Not At Risk (03/04/2023)  Depression (PHQ2-9): Low Risk  (05/07/2019)  Financial Resource Strain: Low Risk  (10/14/2022)   Received from [facility]  Social Connections: Moderately Isolated (03/04/2023)  Tobacco Use: Medium Risk (03/04/2023)    Readmission Risk Interventions     No data to display

## 2023-03-06 ENCOUNTER — Encounter: Payer: Self-pay | Admitting: Orthopedic Surgery

## 2023-05-13 ENCOUNTER — Other Ambulatory Visit

## 2023-05-20 NOTE — Discharge Instructions (Addendum)
 Instructions after Total Knee Replacement   Ernest P. Angie Fava., M.D.    Dept. of Orthopaedics & Sports Medicine [facility] [address] [place], [place]  [address]  Phone: [phone]   Fax: [phone]       [email or web address]       DIET: Drink plenty of non-alcoholic fluids. Resume your normal diet. Include foods high in fiber.  ACTIVITY:  You may use crutches or a walker with weight-bearing as tolerated, unless instructed otherwise. You may be weaned off of the walker or crutches by your Physical Therapist.  Do NOT place pillows under the knee. Anything placed under the knee could limit your ability to straighten the knee.   Use the Bone Foam 3 times a day for 30 minutes each session to help straighten the knee. Continue doing gentle exercises. Exercising will reduce the pain and swelling, increase motion, and prevent muscle weakness.   Please continue to use the TED compression stockings for 6 weeks. You may remove the stockings at night, but should reapply them in the morning. Do not drive or operate any equipment until instructed.  WOUND CARE:  The initial dressing (Aquacel) can remain in place for 7 days (see separate instructions). Continue to use the PolarCare or ice packs periodically to reduce pain and swelling. You may bathe or shower after the staples are removed at the first office visit following surgery.  MEDICATIONS: You may resume your regular medications. Please take the pain medication as prescribed on the medication. Do not take pain medication on an empty stomach. Unless instructed otherwise, you should take an enteric-coated aspirin 81 mg. TWICE a day. (This along with elevation will help reduce the possibility of blood clots/phlebitis in your operated leg.) Use a stool softener (such as Senokot-S or Colace) daily and a laxative (such as Miralax or Dulcolax) as needed to prevent constipation.  Do not drive or drink alcoholic beverages when  taking pain medications.  CALL THE OFFICE FOR: Temperature above 101 degrees Excessive bleeding or drainage on the dressing. Excessive swelling, coldness, or paleness of the toes. Persistent nausea and vomiting.  FOLLOW-UP:  You should have an appointment to return to the office in 10-14 days after surgery. Arrangements have been made for continuation of Physical Therapy (either home therapy or outpatient therapy).     [facility] Department Directory         [email or web address]       [email or web address]          Cardiology  Appointments: [place] - [phone]  Endocrinology  Appointments: [place] [phone] [place] - [phone]  Gastroenterology  Appointments: [place] [phone] [place] - [phone]        General Surgery   Appointments: [facility]  Internal Medicine/Family Medicine  Appointments: [facility] [place] - [phone] [place] - [phone]  Metabolic and Weigh Loss Surgery  Appointments: [facility]        Neurology  Appointments: [place] [phone] [place] - [phone]  Neurosurgery  Appointments: [place]  Obstetrics & Gynecology  Appointments: [place] [phone] [place] - [phone]        Pediatrics  Appointments: [place] [phone] [place] - [phone]  Physiatry  Appointments: [place] [phone]  Physical Therapy  Appointments: [place] - [phone]        Podiatry  Appointments: [place] [phone] [place] - [phone]  Pulmonology  Appointments: [place]  Rheumatology  Appointments: [place] [phone]  [place] Location: [facility]  [address] [place], [place]  [address]  [place] Location: [facility]. [address] [place], [place]  [address]  [place]  Location: [facility] [address] [place], [place]  [address]     [employer]  [address] [place], [place], [address]. [phone] They will call you to arrange when they can come to see you

## 2023-05-26 ENCOUNTER — Encounter
Admission: RE | Admit: 2023-05-26 | Discharge: 2023-05-26 | Disposition: A | Discharge status: Home / Self Care | Source: Ambulatory Visit | Attending: Orthopedic Surgery | Admitting: Orthopedic Surgery

## 2023-05-26 ENCOUNTER — Other Ambulatory Visit: Payer: Self-pay

## 2023-05-26 VITALS — BP 140/82 | HR 82 | Resp 12 | Ht 74.0 in | Wt 197.1 lb

## 2023-05-26 DIAGNOSIS — M1712 Unilateral primary osteoarthritis, left knee: Secondary | ICD-10-CM | POA: Insufficient documentation

## 2023-05-26 DIAGNOSIS — Z01812 Encounter for preprocedural laboratory examination: Secondary | ICD-10-CM | POA: Diagnosis present

## 2023-05-26 DIAGNOSIS — E1159 Type 2 diabetes mellitus with other circulatory complications: Secondary | ICD-10-CM | POA: Diagnosis not present

## 2023-05-26 DIAGNOSIS — I714 Abdominal aortic aneurysm, without rupture, unspecified: Secondary | ICD-10-CM | POA: Insufficient documentation

## 2023-05-26 DIAGNOSIS — Z01818 Encounter for other preprocedural examination: Secondary | ICD-10-CM

## 2023-05-26 HISTORY — DX: Atherosclerosis of aorta: I70.0

## 2023-05-26 HISTORY — DX: Bilateral primary osteoarthritis of knee: M17.0

## 2023-05-26 HISTORY — DX: Personal history of nicotine dependence: Z87.891

## 2023-05-26 HISTORY — DX: Type 2 diabetes mellitus without complications: E11.9

## 2023-05-26 HISTORY — DX: Fatty (change of) liver, not elsewhere classified: K76.0

## 2023-05-26 HISTORY — DX: Emphysema, unspecified: J43.9

## 2023-05-26 LAB — CBC
HCT: 46 % (ref 39.0–52.0)
Hemoglobin: 15.7 g/dL (ref 13.0–17.0)
MCH: 28.4 pg (ref 26.0–34.0)
MCHC: 34.1 g/dL (ref 30.0–36.0)
MCV: 83.3 fL (ref 80.0–100.0)
Platelets: 228 10*3/uL (ref 150–400)
RBC: 5.52 MIL/uL (ref 4.22–5.81)
RDW: 13.6 % (ref 11.5–15.5)
WBC: 6.9 10*3/uL (ref 4.0–10.5)
nRBC: 0 % (ref 0.0–0.2)

## 2023-05-26 LAB — URINALYSIS, ROUTINE W REFLEX MICROSCOPIC
Bacteria, UA: NONE SEEN
Bilirubin Urine: NEGATIVE
Glucose, UA: >=500 mg/dL — AB
Hgb urine dipstick: NEGATIVE
Ketones, ur: NEGATIVE mg/dL
Leukocytes,Ua: NEGATIVE
Nitrite: NEGATIVE
Protein, ur: NEGATIVE mg/dL
Specific Gravity, Urine: 1.038 — ABNORMAL HIGH (ref 1.005–1.030)
Squamous Epithelial / HPF: 0 /HPF (ref 0–5)
pH: 5 (ref 5.0–8.0)

## 2023-05-26 LAB — SEDIMENTATION RATE: Sed Rate: 4 mm/h (ref 0–20)

## 2023-05-26 LAB — SURGICAL PCR SCREEN
MRSA, PCR: NEGATIVE
Staphylococcus aureus: POSITIVE — AB

## 2023-05-26 LAB — C-REACTIVE PROTEIN: CRP: 0.9 mg/dL (ref <1.0)

## 2023-05-26 NOTE — Patient Instructions (Addendum)
 Your procedure is scheduled on:06-08-23 Wednesday Report to the Registration Desk on the 1st floor of the [facility].Then proceed to the 2nd floor Surgery Desk To find out your arrival time, please call [phone] between 1PM - 3PM on:06-07-23 Tuesday If your arrival time is 6:00 am, do not arrive before that time as the Medical Mall entrance doors do not open until 6:00 am.  REMEMBER: Instructions that are not followed completely may result in serious medical risk, up to and including death; or upon the discretion of your surgeon and anesthesiologist your surgery may need to be rescheduled.  Do not eat food after midnight the night before surgery.  No gum chewing or hard candies.  You may however, drink Water up to 2 hours before you are scheduled to arrive for your surgery. Do not drink anything within 2 hours of your scheduled arrival time.  In addition, your doctor has ordered for you to drink the provided:  Gatorade G2 Drinking this carbohydrate drink up to two hours before surgery helps to reduce insulin resistance and improve patient outcomes. Please complete drinking 2 hours before scheduled arrival time.  One week prior to surgery: Last dose will be on 05-31-23  Stop Anti-inflammatories (NSAIDS) such as Advil, Aleve, Ibuprofen, Motrin, Naproxen, Naprosyn and Aspirin based products such as Excedrin, Goody's Powder, BC Powder. Stop ANY OVER THE COUNTER supplements until after surgery (Vitamin D3, Vitamin B12, Vitamin C)  You may however, continue to take Tylenol if needed for pain up until the day of surgery.  Stop empagliflozin (JARDIANCE) 3 days prior to surgery-Last dose will be on 06-04-23 Saturday  Stop metFORMIN (GLUCOPHAGE-XR) 2 days prior to surgery-Last dose will be on 06-05-23 Sunday  Continue taking all of your other prescription medications up until the day of surgery.  ON THE DAY OF SURGERY ONLY TAKE THESE MEDICATIONS WITH SIPS OF WATER: -pravastatin (PRAVACHOL)    Continue your 81 mg Aspirin up until the day prior to surgery -Do NOT take the morning of surgery  No Alcohol for 24 hours before or after surgery.  No Smoking including e-cigarettes for 24 hours before surgery.  No chewable tobacco products for at least 6 hours before surgery.  No nicotine patches on the day of surgery.  Do not use any "recreational" drugs for at least a week (preferably 2 weeks) before your surgery.  Please be advised that the combination of cocaine and anesthesia may have negative outcomes, up to and including death. If you test positive for cocaine, your surgery will be cancelled.  On the morning of surgery brush your teeth with toothpaste and water, you may rinse your mouth with mouthwash if you wish. Do not swallow any toothpaste or mouthwash.  Use CHG Soap as directed on instruction sheet.  Do not wear jewelry, make-up, hairpins, clips or nail polish.  For welded (permanent) jewelry: bracelets, anklets, waist bands, etc.  Please have this removed prior to surgery.  If it is not removed, there is a chance that hospital personnel will need to cut it off on the day of surgery.  Do not wear lotions, powders, or perfumes.   Do not shave body hair from the neck down 48 hours before surgery.  Contact lenses, hearing aids and dentures may not be worn into surgery.  Do not bring valuables to the hospital. [facility] is not responsible for any missing/lost belongings or valuables.   Notify your doctor if there is any change in your medical condition (cold, fever, infection).  Wear comfortable clothing (specific to your surgery type) to the hospital.  After surgery, you can help prevent lung complications by doing breathing exercises.  Take deep breaths and cough every 1-2 hours. Your doctor may order a device called an Incentive Spirometer to help you take deep breaths. When coughing or sneezing, hold a pillow firmly against your incision with both hands. This is  called "splinting." Doing this helps protect your incision. It also decreases belly discomfort.  If you are being admitted to the hospital overnight, leave your suitcase in the car. After surgery it may be brought to your room.  In case of increased patient census, it may be necessary for you, the patient, to continue your postoperative care in the Same Day Surgery department.  If you are being discharged the day of surgery, you will not be allowed to drive home. You will need a responsible individual to drive you home and stay with you for 24 hours after surgery.   If you are taking public transportation, you will need to have a responsible individual with you.  Please call the Pre-admissions Testing Dept. at [phone] if you have any questions about these instructions.  Surgery Visitation Policy:  Patients having surgery or a procedure may have two visitors.  Children under the age of 75 must have an adult with them who is not the patient.  Inpatient Visitation:    Visiting hours are 7 a.m. to 8 p.m. Up to four visitors are allowed at one time in a patient room. The visitors may rotate out with other people during the day.  One visitor age 70 or older may stay with the patient overnight and must be in the room by 8 p.m.    Pre-operative 5 CHG Bath Instructions   You can play a key role in reducing the risk of infection after surgery. Your skin needs to be as free of germs as possible. You can reduce the number of germs on your skin by washing with CHG (chlorhexidine gluconate) soap before surgery. CHG is an antiseptic soap that kills germs and continues to kill germs even after washing.   DO NOT use if you have an allergy to chlorhexidine/CHG or antibacterial soaps. If your skin becomes reddened or irritated, stop using the CHG and notify one of our RNs at [phone].   Please shower with the CHG soap starting 4 days before surgery using the following schedule:     Please  keep in mind the following:  DO NOT shave, including legs and underarms, starting the day of your first shower.   You may shave your face at any point before/day of surgery.  Place clean sheets on your bed the day you start using CHG soap. Use a clean washcloth (not used since being washed) for each shower. DO NOT sleep with pets once you start using the CHG.   CHG Shower Instructions:  If you choose to wash your hair and private area, wash first with your normal shampoo/soap.  After you use shampoo/soap, rinse your hair and body thoroughly to remove shampoo/soap residue.  Turn the water OFF and apply about 3 tablespoons (45 ml) of CHG soap to a CLEAN washcloth.  Apply CHG soap ONLY FROM YOUR NECK DOWN TO YOUR TOES (washing for 3-5 minutes)  DO NOT use CHG soap on face, private areas, open wounds, or sores.  Pay special attention to the area where your surgery is being performed.  If you are having back surgery, having  someone wash your back for you may be helpful. Wait 2 minutes after CHG soap is applied, then you may rinse off the CHG soap.  Pat dry with a clean towel  Put on clean clothes/pajamas   If you choose to wear lotion, please use ONLY the CHG-compatible lotions on the back of this paper.     Additional instructions for the day of surgery: DO NOT APPLY any lotions, deodorants, cologne, or perfumes.   Put on clean/comfortable clothes.  Brush your teeth.  Ask your nurse before applying any prescription medications to the skin.      CHG Compatible Lotions   Aveeno Moisturizing lotion  Cetaphil Moisturizing Cream  Cetaphil Moisturizing Lotion  Clairol Herbal Essence Moisturizing Lotion, Dry Skin  Clairol Herbal Essence Moisturizing Lotion, Extra Dry Skin  Clairol Herbal Essence Moisturizing Lotion, Normal Skin  Curel Age Defying Therapeutic Moisturizing Lotion with Alpha Hydroxy  Curel Extreme Care Body Lotion  Curel Soothing Hands Moisturizing Hand Lotion  Curel  Therapeutic Moisturizing Cream, Fragrance-Free  Curel Therapeutic Moisturizing Lotion, Fragrance-Free  Curel Therapeutic Moisturizing Lotion, Original Formula  Eucerin Daily Replenishing Lotion  Eucerin Dry Skin Therapy Plus Alpha Hydroxy Crme  Eucerin Dry Skin Therapy Plus Alpha Hydroxy Lotion  Eucerin Original Crme  Eucerin Original Lotion  Eucerin Plus Crme Eucerin Plus Lotion  Eucerin TriLipid Replenishing Lotion  Keri Anti-Bacterial Hand Lotion  Keri Deep Conditioning Original Lotion Dry Skin Formula Softly Scented  Keri Deep Conditioning Original Lotion, Fragrance Free Sensitive Skin Formula  Keri Lotion Fast Absorbing Fragrance Free Sensitive Skin Formula  Keri Lotion Fast Absorbing Softly Scented Dry Skin Formula  Keri Original Lotion  Keri Skin Renewal Lotion Keri Silky Smooth Lotion  Keri Silky Smooth Sensitive Skin Lotion  Nivea Body Creamy Conditioning Oil  Nivea Body Extra Enriched Lotion  Nivea Body Original Lotion  Nivea Body Sheer Moisturizing Lotion Nivea Crme  Nivea Skin Firming Lotion  NutraDerm 30 Skin Lotion  NutraDerm Skin Lotion  NutraDerm Therapeutic Skin Cream  NutraDerm Therapeutic Skin Lotion  ProShield Protective Hand Cream  Provon moisturizing lotion  How to Use an Incentive Spirometer An incentive spirometer is a tool that measures how well you are filling your lungs with each breath. Learning to take long, deep breaths using this tool can help you keep your lungs clear and active. This may help to reverse or lessen your chance of developing breathing (pulmonary) problems, especially infection. You may be asked to use a spirometer: After a surgery. If you have a lung problem or a history of smoking. After a long period of time when you have been unable to move or be active. If the spirometer includes an indicator to show the highest number that you have reached, your health care provider or respiratory therapist will help you set a goal. Keep a log  of your progress as told by your health care provider. What are the risks? Breathing too quickly may cause dizziness or cause you to pass out. Take your time so you do not get dizzy or light-headed. If you are in pain, you may need to take pain medicine before doing incentive spirometry. It is harder to take a deep breath if you are having pain. How to use your incentive spirometer  Sit up on the edge of your bed or on a chair. Hold the incentive spirometer so that it is in an upright position. Before you use the spirometer, breathe out normally. Place the mouthpiece in your mouth. Make sure your lips  are closed tightly around it. Breathe in slowly and as deeply as you can through your mouth, causing the piston or the ball to rise toward the top of the chamber. Hold your breath for 3-5 seconds, or for as long as possible. If the spirometer includes a coach indicator, use this to guide you in breathing. Slow down your breathing if the indicator goes above the marked areas. Remove the mouthpiece from your mouth and breathe out normally. The piston or ball will return to the bottom of the chamber. Rest for a few seconds, then repeat the steps 10 or more times. Take your time and take a few normal breaths between deep breaths so that you do not get dizzy or light-headed. Do this every 1-2 hours when you are awake. If the spirometer includes a goal marker to show the highest number you have reached (best effort), use this as a goal to work toward during each repetition. After each set of 10 deep breaths, cough a few times. This will help to make sure that your lungs are clear. If you have an incision on your chest or abdomen from surgery, place a pillow or a rolled-up towel firmly against the incision when you cough. This can help to reduce pain while taking deep breaths and coughing. General tips When you are able to get out of bed: Walk around often. Continue to take deep breaths and cough in  order to clear your lungs. Keep using the incentive spirometer until your health care provider says it is okay to stop using it. If you have been in the hospital, you may be told to keep using the spirometer at home. Contact a health care provider if: You are having difficulty using the spirometer. You have trouble using the spirometer as often as instructed. Your pain medicine is not giving enough relief for you to use the spirometer as told. You have a fever. Get help right away if: You develop shortness of breath. You develop a cough with bloody mucus from the lungs. You have fluid or blood coming from an incision site after you cough. Summary An incentive spirometer is a tool that can help you learn to take long, deep breaths to keep your lungs clear and active. You may be asked to use a spirometer after a surgery, if you have a lung problem or a history of smoking, or if you have been inactive for a long period of time. Use your incentive spirometer as instructed every 1-2 hours while you are awake. If you have an incision on your chest or abdomen, place a pillow or a rolled-up towel firmly against your incision when you cough. This will help to reduce pain. Get help right away if you have shortness of breath, you cough up bloody mucus, or blood comes from your incision when you cough. This information is not intended to replace advice given to you by your health care provider. Make sure you discuss any questions you have with your health care provider.     Preoperative Educational Videos for Total Hip, Knee and Shoulder Replacements  To better prepare for surgery, please view our videos that explain the physical activity and discharge planning required to have the best surgical recovery at [facility].  [email or web address]  Questions? Call [phone] or email [email or web address]

## 2023-06-05 NOTE — H&P (Signed)
 ORTHOPAEDIC HISTORY & PHYSICAL Ernest Brown, Ernest Mings., MD - 05/31/2023 10:30 AM EDT Formatting of this note is different from the original. Images from the original note were not included. Chief Complaint: Chief Complaint Patient presents with Post Operative Visit RT TKA - 01.03.25/[facility]  Reason for Visit: The patient is a 73 y.o. male who presents today for reevaluation of both knees. He has a long history of left knee pain. He localizes most of the pain along the medial aspect of the knee. He reports some swelling, no locking, and some giving way of the knee. The pain is aggravated by any weight bearing. The knee pain limits the patient's ability to ambulate long distances. The patient has not appreciated any significant improvement despite topical analgesics, activity modification, and intraarticular corticosteroid injections. He is not using any ambulatory aids. The patient states that the left knee pain has progressed to the point that it is significantly interfering with his activities of daily living.  He is 3 months status post right total knee arthroplasty. He denies any significant right knee pain, swelling, locking, or giving way. He denies any problems with the surgical incision. He does notice some muscle soreness to the lateral gastrocnemius muscle after using the right leg to dig with a shovel.  Medications: Current Outpatient Medications Medication Sig Dispense Refill amLODIPine (NORVASC) 5 MG tablet TAKE 1 TABLET(5 MG) BY MOUTH DAILY 90 tablet 1 ascorbic acid, vitamin C, (VITAMIN C) 1,000 mg TbER Take 1,000 mg by mouth once daily aspirin 81 MG EC tablet Take 81 mg by mouth once daily cholecalciferol (VITAMIN D3) 1000 unit tablet Take 1,000 Units by mouth once daily cyanocobalamin (VITAMIN B12) 1000 MCG tablet Take 1,000 mcg by mouth once daily. DM/p-ephed/acetaminoph/doxylam (NYQUIL D ORAL) Take 1 capsule by mouth at bedtime as needed ((sleep).) empagliflozin (JARDIANCE) 25  mg tablet Take 1 tablet (25 mg total) by mouth once daily 90 tablet 3 finasteride (PROSCAR) 5 mg tablet Take 5 mg by mouth once daily metFORMIN (GLUCOPHAGE-XR) 500 MG XR tablet Take 1 tablet (500 mg total) by mouth 2 (two) times daily 60 tablet 11 pantoprazole (PROTONIX) 40 MG DR tablet Take 1 tablet (40 mg total) by mouth once daily as needed (ACID REFLUX) pravastatin (PRAVACHOL) 20 MG tablet Take 1 tablet (20 mg total) by mouth at bedtime 90 tablet 1 sennosides-docusate (SENOKOT-S) 8.6-50 mg tablet Take 1 tablet by mouth 2 (two) times daily  No current facility-administered medications for this visit.  Allergies: Allergies Allergen Reactions Codeine Nausea and Rash  Past Medical History: Past Medical History: Diagnosis Date Arrhythmia Diabetes mellitus without complication (CMS/HHS-HCC) Gastroesophageal reflux disease without esophagitis 08/09/2022 Osteoarthritis Tobacco use  Past Surgical History: Past Surgical History: Procedure Laterality Date FLEXIBLE SIGMOIDOSCOPY 09/15/1995 Colon Polyp @ 20cm COLONOSCOPY 10/21/1995 Hyperplastic Polyps FLEXIBLE SIGMOIDOSCOPY 03/07/1998 Colon Polyp @ 5cm COLONOSCOPY 12/19/2001 Hyperplastic Polyps COLONOSCOPY 04/02/2010 Dr. Maggie Font @ [facility] - Adenomatous Polyp: Recall 02/18/15 COLONOSCOPY 12/08/2020 INTERNAL HEMORRHOIDS, CBF 2032 Right total knee arthroplasty using computer-assisted navigation 03/04/2023 Dr Ernest Pine foot surgery Bilateral rare issue with knots in the arch KNEE ARTHROSCOPY  Social History: Social History  Socioeconomic History Marital status: Married Spouse name: Ernest Brown Number of children: 2 Years of education: 12 Highest education level: High school graduate Occupational History Occupation: Retired- Insurance claims handler Tobacco Use Smoking status: Former Current packs/day: 0.00 Average packs/day: 1 pack/day for 40.0 years (40.0 ttl pk-yrs) Types: Cigarettes Start date: 04/30/1978 Quit date: 04/30/2018 Years since  quitting: 5.0 Smokeless tobacco: Never Tobacco comments: trying to quit  Vaping Use Vaping status: Never Used Substance and Sexual Activity Alcohol use: Never Drug use: Never Sexual activity: Not Currently Partners: Female  Social Drivers of Health  Financial Resource Strain: Low Risk (10/14/2022) Overall Financial Resource Strain (CARDIA) Difficulty of Paying Living Expenses: Not hard at all Food Insecurity: No Food Insecurity (03/04/2023) Received from [facility] Hunger Vital Sign Worried About Running Out of Food in the Last Year: Never true Ran Out of Food in the Last Year: Never true Transportation Needs: No Transportation Needs (03/04/2023) Received from [facility] - Transportation Lack of Transportation (Medical): No Lack of Transportation (Non-Medical): No Social Connections: Moderately Isolated (03/04/2023) Received from [facility] Social Connection and Isolation Panel [NHANES] Frequency of Communication with Friends and Family: Once a week Frequency of Social Gatherings with Friends and Family: Once a week Attends Religious Services: 1 to 4 times per year Active Member of [employer] or Organizations: No Attends Banker Meetings: Patient declined Marital Status: Married Housing Stability: Unknown (04/02/2023) Housing Stability Vital Sign Unable to Pay for Housing in the Last Year: No Homeless in the Last Year: No  Family History: Family History Problem Relation Name Age of Onset Diabetes type II Mother Ernest Brown Deceased Myocardial Infarction (Heart attack) Father No Known Problems Sister No Known Problems Brother No Known Problems Maternal Grandmother No Known Problems Maternal Grandfather No Known Problems Paternal Grandmother No Known Problems Paternal Grandfather No Known Problems Sister No Known Problems Son No Known Problems Daughter Myocardial Infarction (Heart attack) Paternal Uncle Myocardial Infarction (Heart attack) Paternal  Aunt  Review of Systems: A comprehensive 14 point ROS was performed, reviewed, and the pertinent orthopaedic findings are documented in the HPI.  Exam BP 126/78  Ht 182.9 cm (6')  Wt 88.5 kg (195 lb)  BMI 26.45 kg/m  General: Well-developed, well-nourished male seen in no acute distress. Antalgic gait. Varus thrust to the left knee.  HEENT: Atraumatic, normocephalic. Pupils are equal and reactive to light. Extraocular motion is intact. Sclera are clear. Oropharynx is clear with moist mucosa.  Neck: Supple, nontender, and with good ROM. No thyromegaly, adenopathy, JVD, or carotid bruits.  Lungs: Clear to auscultation bilaterally.  Cardiovascular: Regular rate and rhythm. Normal S1, S2. No murmur . No appreciable gallops or rubs. Peripheral pulses are palpable. No lower extremity edema. Homan`s test is negative.  Abdomen: Soft, nontender, nondistended. Bowel sounds are present.  Extremities: Good strength, stability, and range of motion of the upper extremities. Good range of motion of the hips and ankles.  Left Knee: Soft tissue swelling: mild Effusion: minimal Erythema: none Crepitance: mild Tenderness: medial Alignment: relative varus Mediolateral laxity: medial pseudolaxity Posterior sag: negative Patellar tracking: Good tracking without evidence of subluxation or tilt Atrophy: No significant atrophy. Quadriceps tone was good. Range of motion: 0/0/117 degrees  Right Knee: Soft tissue swelling: minimal Effusion: none Erythema: none Crepitance: none Tenderness: No focal tenderness Alignment: normal Mediolateral laxity: stable Atrophy: No significant atrophy. Quadriceps tone was good. Range of Motion: 0/0/122 degrees  Neurologic: Awake, alert, and oriented. Sensory function is intact to pinprick and light touch. Motor strength is judged to be 5/5. Motor coordination is within normal limits. No apparent clonus. No tremor.  X-rays: I ordered and  interpreted standing AP, lateral, and sunrise radiographs of the left knee that were obtained in the office today. There is significant narrowing of the medial cartilage space with near bone-on-bone articulation and associated varus alignment. Osteophyte formation is noted. Subchondral sclerosis is noted. No evidence of  fracture or dislocation.  I ordered and interpreted standing AP, lateral, and sunrise views of the right knee that were obtained in the office today. Good position of the total knee implants. Good alignment is noted on the AP view. Good cement mantle is appreciated without evidence of loosening. No evidence of polyethylene wear or osteolysis. No evidence of fracture or dislocation.  Impression: Degenerative arthrosis of the left knee Right total knee arthroplasty  Plan: The findings were discussed in detail with the patient. He continues to do well with the right knee. The patient was given informational material on total knee replacement. Conservative treatment options were reviewed with the patient. We discussed the risks and benefits of surgical intervention. The usual perioperative course was also discussed in detail. The patient expressed understanding of the risks and benefits of surgical intervention and would like to proceed with plans for left total knee arthroplasty.  I spent a total of 45 minutes in both face-to-face and non-face-to-face activities, excluding procedures performed, for this visit on the date of this encounter.  MEDICAL CLEARANCE: Per anesthesiology. ACTIVITY: As tolerated. WORK STATUS: Not applicable. THERAPY: Preoperative physical therapy evaluation. MEDICATIONS: Requested Prescriptions  No prescriptions requested or ordered in this encounter  FOLLOW-UP: Return for postoperative follow-up.  Arely Tinner P. Angie Fava., M.D.  This note was generated in part with voice recognition software and I apologize for any typographical errors that were not  detected and corrected.  Electronically signed by Shari Heritage., MD at 06/02/2023 10:36 PM EDT

## 2023-06-08 ENCOUNTER — Ambulatory Visit: Payer: Self-pay | Admitting: Urgent Care

## 2023-06-08 ENCOUNTER — Other Ambulatory Visit: Payer: Self-pay

## 2023-06-08 ENCOUNTER — Observation Stay
Admission: RE | Admit: 2023-06-08 | Discharge: 2023-06-09 | Disposition: A | Discharge status: Home / Self Care | Attending: Orthopedic Surgery | Admitting: Orthopedic Surgery

## 2023-06-08 ENCOUNTER — Ambulatory Visit: Admitting: Registered Nurse

## 2023-06-08 ENCOUNTER — Observation Stay

## 2023-06-08 ENCOUNTER — Encounter
Admission: RE | Disposition: A | Discharge status: Home / Self Care | Payer: Self-pay | Source: Home / Self Care | Attending: Orthopedic Surgery

## 2023-06-08 ENCOUNTER — Encounter: Payer: Self-pay | Admitting: Orthopedic Surgery

## 2023-06-08 DIAGNOSIS — E1159 Type 2 diabetes mellitus with other circulatory complications: Secondary | ICD-10-CM

## 2023-06-08 DIAGNOSIS — Z79899 Other long term (current) drug therapy: Secondary | ICD-10-CM | POA: Insufficient documentation

## 2023-06-08 DIAGNOSIS — M1712 Unilateral primary osteoarthritis, left knee: Principal | ICD-10-CM

## 2023-06-08 DIAGNOSIS — I714 Abdominal aortic aneurysm, without rupture, unspecified: Secondary | ICD-10-CM

## 2023-06-08 DIAGNOSIS — Z7901 Long term (current) use of anticoagulants: Secondary | ICD-10-CM | POA: Diagnosis not present

## 2023-06-08 DIAGNOSIS — Z96652 Presence of left artificial knee joint: Secondary | ICD-10-CM | POA: Insufficient documentation

## 2023-06-08 DIAGNOSIS — Z87891 Personal history of nicotine dependence: Secondary | ICD-10-CM | POA: Diagnosis not present

## 2023-06-08 DIAGNOSIS — M25562 Pain in left knee: Secondary | ICD-10-CM | POA: Diagnosis present

## 2023-06-08 HISTORY — PX: KNEE ARTHROPLASTY: SHX992

## 2023-06-08 LAB — GLUCOSE, CAPILLARY
Glucose-Capillary: 140 mg/dL — ABNORMAL HIGH (ref 70–99)
Glucose-Capillary: 220 mg/dL — ABNORMAL HIGH (ref 70–99)
Glucose-Capillary: 227 mg/dL — ABNORMAL HIGH (ref 70–99)

## 2023-06-08 SURGERY — ARTHROPLASTY, KNEE, TOTAL, USING IMAGELESS COMPUTER-ASSISTED NAVIGATION
Anesthesia: Spinal | Site: Knee | Laterality: Left

## 2023-06-08 SURGERY — ARTHROPLASTY, KNEE, TOTAL, USING IMAGELESS COMPUTER-ASSISTED NAVIGATION
Anesthesia: Choice | Site: Knee | Laterality: Left

## 2023-06-08 MED ORDER — TRANEXAMIC ACID-NACL 1000-0.7 MG/100ML-% IV SOLN
1000.0000 mg | INTRAVENOUS | Status: AC
Start: 2023-06-08 — End: 2023-06-08
  Administered 2023-06-08: 1000 mg via INTRAVENOUS

## 2023-06-08 MED ORDER — DEXAMETHASONE SODIUM PHOSPHATE 10 MG/ML IJ SOLN
8.0000 mg | Freq: Once | INTRAMUSCULAR | Status: AC
  Administered 2023-06-08: 8 mg via INTRAVENOUS

## 2023-06-08 MED ORDER — LIDOCAINE HCL (CARDIAC) PF 100 MG/5ML IV SOSY
PREFILLED_SYRINGE | INTRAVENOUS | Status: DC | PRN
  Administered 2023-06-08: 50 mg via INTRAVENOUS

## 2023-06-08 MED ORDER — CHLORHEXIDINE GLUCONATE 4 % EX SOLN
60.0000 mL | Freq: Once | CUTANEOUS | Status: AC
  Administered 2023-06-08: 4 via TOPICAL

## 2023-06-08 MED ORDER — BISACODYL 10 MG RE SUPP: 10.0000 mg | Freq: Every day | RECTAL | Status: DC | PRN

## 2023-06-08 MED ORDER — AMLODIPINE BESYLATE 10 MG PO TABS: 5.0000 mg | ORAL_TABLET | Freq: Every day | ORAL | Status: DC

## 2023-06-08 MED ORDER — SODIUM CHLORIDE 0.9 % IR SOLN
Status: DC | PRN
Start: 2023-06-08 — End: 2023-06-08
  Administered 2023-06-08: 3000 mL

## 2023-06-08 MED ORDER — BUPIVACAINE HCL (PF) 0.5 % IJ SOLN
INTRAMUSCULAR | Status: DC | PRN
  Administered 2023-06-08: 3 mL via INTRATHECAL

## 2023-06-08 MED ORDER — METFORMIN HCL ER 500 MG PO TB24
ORAL_TABLET | ORAL | Status: AC
  Filled 2023-06-08: qty 1

## 2023-06-08 MED ORDER — CEFAZOLIN SODIUM-DEXTROSE 2-4 GM/100ML-% IV SOLN
2.0000 g | INTRAVENOUS | Status: AC
  Administered 2023-06-08: 2 g via INTRAVENOUS

## 2023-06-08 MED ORDER — ONDANSETRON HCL 4 MG/2ML IJ SOLN
INTRAMUSCULAR | Status: AC
  Filled 2023-06-08: qty 2

## 2023-06-08 MED ORDER — ACETAMINOPHEN 325 MG PO TABS: 325.0000 mg | ORAL_TABLET | Freq: Four times a day (QID) | ORAL | Status: DC | PRN

## 2023-06-08 MED ORDER — DEXAMETHASONE SODIUM PHOSPHATE 10 MG/ML IJ SOLN
INTRAMUSCULAR | Status: AC
Start: 2023-06-08 — End: ?
  Filled 2023-06-08: qty 1

## 2023-06-08 MED ORDER — FENTANYL CITRATE (PF) 100 MCG/2ML IJ SOLN
INTRAMUSCULAR | Status: AC
  Filled 2023-06-08: qty 2

## 2023-06-08 MED ORDER — INSULIN ASPART 100 UNIT/ML IJ SOLN
0.0000 [IU] | Freq: Three times a day (TID) | INTRAMUSCULAR | Status: DC
  Filled 2023-06-08: qty 1

## 2023-06-08 MED ORDER — GABAPENTIN 300 MG PO CAPS
ORAL_CAPSULE | ORAL | Status: AC
  Filled 2023-06-08: qty 1

## 2023-06-08 MED ORDER — HYDROMORPHONE HCL 1 MG/ML IJ SOLN: 0.5000 mg | INTRAMUSCULAR | Status: DC | PRN

## 2023-06-08 MED ORDER — OXYCODONE HCL 5 MG PO TABS
5.0000 mg | ORAL_TABLET | ORAL | Status: DC | PRN
  Administered 2023-06-08 (×2): 5 mg via ORAL
  Filled 2023-06-08 (×2): qty 1

## 2023-06-08 MED ORDER — ORAL CARE MOUTH RINSE: 15.0000 mL | Freq: Once | OROMUCOSAL | Status: AC

## 2023-06-08 MED ORDER — TRAMADOL HCL 50 MG PO TABS: 50.0000 mg | ORAL_TABLET | ORAL | Status: DC | PRN

## 2023-06-08 MED ORDER — TRANEXAMIC ACID-NACL 1000-0.7 MG/100ML-% IV SOLN
INTRAVENOUS | Status: AC
  Filled 2023-06-08: qty 100

## 2023-06-08 MED ORDER — SENNOSIDES-DOCUSATE SODIUM 8.6-50 MG PO TABS
1.0000 | ORAL_TABLET | Freq: Two times a day (BID) | ORAL | Status: DC
  Administered 2023-06-08 – 2023-06-09 (×2): 1 via ORAL
  Filled 2023-06-08 (×2): qty 1

## 2023-06-08 MED ORDER — ONDANSETRON HCL 4 MG/2ML IJ SOLN
INTRAMUSCULAR | Status: DC | PRN
  Administered 2023-06-08: 4 mg via INTRAVENOUS

## 2023-06-08 MED ORDER — DEXAMETHASONE SODIUM PHOSPHATE 10 MG/ML IJ SOLN
INTRAMUSCULAR | Status: AC
  Filled 2023-06-08: qty 1

## 2023-06-08 MED ORDER — MIDAZOLAM HCL 2 MG/2ML IJ SOLN
INTRAMUSCULAR | Status: AC
  Filled 2023-06-08: qty 2

## 2023-06-08 MED ORDER — FLEET ENEMA RE ENEM: 1.0000 | ENEMA | Freq: Once | RECTAL | Status: DC | PRN

## 2023-06-08 MED ORDER — ASPIRIN 81 MG PO CHEW
81.0000 mg | CHEWABLE_TABLET | Freq: Two times a day (BID) | ORAL | Status: DC
  Administered 2023-06-08 – 2023-06-09 (×2): 81 mg via ORAL
  Filled 2023-06-08 (×2): qty 1

## 2023-06-08 MED ORDER — PROPOFOL 1000 MG/100ML IV EMUL
INTRAVENOUS | Status: AC
  Filled 2023-06-08: qty 100

## 2023-06-08 MED ORDER — FERROUS SULFATE 325 (65 FE) MG PO TABS
325.0000 mg | ORAL_TABLET | Freq: Two times a day (BID) | ORAL | Status: DC
  Administered 2023-06-08 – 2023-06-09 (×2): 325 mg via ORAL
  Filled 2023-06-08 (×2): qty 1

## 2023-06-08 MED ORDER — SODIUM CHLORIDE 0.9 % IV SOLN
INTRAVENOUS | Status: DC
Start: 2023-06-08 — End: 2023-06-08

## 2023-06-08 MED ORDER — TRANEXAMIC ACID-NACL 1000-0.7 MG/100ML-% IV SOLN
1000.0000 mg | Freq: Once | INTRAVENOUS | Status: AC
  Administered 2023-06-08: 1000 mg via INTRAVENOUS

## 2023-06-08 MED ORDER — PHENOL 1.4 % MT LIQD: 1.0000 | OROMUCOSAL | Status: DC | PRN

## 2023-06-08 MED ORDER — EMPAGLIFLOZIN 25 MG PO TABS
25.0000 mg | ORAL_TABLET | Freq: Every day | ORAL | Status: DC
  Administered 2023-06-09: 25 mg via ORAL
  Filled 2023-06-08: qty 1

## 2023-06-08 MED ORDER — CEFAZOLIN SODIUM-DEXTROSE 2-4 GM/100ML-% IV SOLN
2.0000 g | Freq: Four times a day (QID) | INTRAVENOUS | Status: AC
  Administered 2023-06-08 (×2): 2 g via INTRAVENOUS
  Filled 2023-06-08 (×2): qty 100

## 2023-06-08 MED ORDER — CELECOXIB 200 MG PO CAPS
400.0000 mg | ORAL_CAPSULE | Freq: Once | ORAL | Status: AC
  Administered 2023-06-08: 400 mg via ORAL

## 2023-06-08 MED ORDER — CHLORHEXIDINE GLUCONATE 4 % EX SOLN: 1.0000 | CUTANEOUS | 1 refills | Status: AC

## 2023-06-08 MED ORDER — LIDOCAINE HCL URETHRAL/MUCOSAL 2 % EX GEL
CUTANEOUS | Status: DC | PRN
  Administered 2023-06-08: 1

## 2023-06-08 MED ORDER — MIDAZOLAM HCL 5 MG/5ML IJ SOLN
INTRAMUSCULAR | Status: DC | PRN
  Administered 2023-06-08: 2 mg via INTRAVENOUS

## 2023-06-08 MED ORDER — SODIUM CHLORIDE (PF) 0.9 % IJ SOLN
INTRAMUSCULAR | Status: DC | PRN
  Administered 2023-06-08: 120 mL via INTRAMUSCULAR

## 2023-06-08 MED ORDER — CELECOXIB 200 MG PO CAPS
200.0000 mg | ORAL_CAPSULE | Freq: Two times a day (BID) | ORAL | Status: DC
  Administered 2023-06-08 – 2023-06-09 (×2): 200 mg via ORAL
  Filled 2023-06-08 (×2): qty 1

## 2023-06-08 MED ORDER — PROPOFOL 10 MG/ML IV BOLUS
INTRAVENOUS | Status: DC | PRN
Start: 2023-06-08 — End: 2023-06-08
  Administered 2023-06-08 (×2): 20 mg via INTRAVENOUS
  Administered 2023-06-08: 100 ug/kg/min via INTRAVENOUS
  Administered 2023-06-08: 40 mg via INTRAVENOUS
  Administered 2023-06-08: 20 mg via INTRAVENOUS

## 2023-06-08 MED ORDER — ONDANSETRON HCL 4 MG/2ML IJ SOLN: 4.0000 mg | Freq: Four times a day (QID) | INTRAMUSCULAR | Status: DC | PRN

## 2023-06-08 MED ORDER — MENTHOL 3 MG MT LOZG: 1.0000 | LOZENGE | OROMUCOSAL | Status: DC | PRN

## 2023-06-08 MED ORDER — OXYCODONE HCL 5 MG/5ML PO SOLN: 5.0000 mg | Freq: Once | ORAL | Status: DC | PRN

## 2023-06-08 MED ORDER — MUPIROCIN 2 % EX OINT: 1.0000 | TOPICAL_OINTMENT | Freq: Two times a day (BID) | CUTANEOUS | 0 refills | Status: AC

## 2023-06-08 MED ORDER — PHENYLEPHRINE HCL-NACL 20-0.9 MG/250ML-% IV SOLN
INTRAVENOUS | Status: DC | PRN
  Administered 2023-06-08: 20 ug/min via INTRAVENOUS

## 2023-06-08 MED ORDER — ALUM & MAG HYDROXIDE-SIMETH 200-200-20 MG/5ML PO SUSP: 30.0000 mL | ORAL | Status: DC | PRN

## 2023-06-08 MED ORDER — CHLORHEXIDINE GLUCONATE 0.12 % MT SOLN
15.0000 mL | Freq: Once | OROMUCOSAL | Status: AC
  Administered 2023-06-08: 15 mL via OROMUCOSAL

## 2023-06-08 MED ORDER — ACETAMINOPHEN 10 MG/ML IV SOLN
INTRAVENOUS | Status: AC
  Filled 2023-06-08: qty 100

## 2023-06-08 MED ORDER — CELECOXIB 200 MG PO CAPS
ORAL_CAPSULE | ORAL | Status: AC
  Filled 2023-06-08: qty 2

## 2023-06-08 MED ORDER — SURGIPHOR WOUND IRRIGATION SYSTEM - OPTIME
TOPICAL | Status: DC | PRN
Start: 2023-06-08 — End: 2023-06-08

## 2023-06-08 MED ORDER — OXYCODONE HCL 5 MG PO TABS: 5.0000 mg | ORAL_TABLET | Freq: Once | ORAL | Status: DC | PRN

## 2023-06-08 MED ORDER — INSULIN ASPART 100 UNIT/ML IJ SOLN: 0.0000 [IU] | Freq: Every day | INTRAMUSCULAR | Status: DC

## 2023-06-08 MED ORDER — MAGNESIUM HYDROXIDE 400 MG/5ML PO SUSP: 30.0000 mL | Freq: Every day | ORAL | Status: DC

## 2023-06-08 MED ORDER — FENTANYL CITRATE (PF) 100 MCG/2ML IJ SOLN: 25.0000 ug | INTRAMUSCULAR | Status: DC | PRN

## 2023-06-08 MED ORDER — ACETAMINOPHEN 10 MG/ML IV SOLN
1000.0000 mg | Freq: Four times a day (QID) | INTRAVENOUS | Status: DC
  Administered 2023-06-08 – 2023-06-09 (×2): 1000 mg via INTRAVENOUS
  Filled 2023-06-08 (×3): qty 100

## 2023-06-08 MED ORDER — SODIUM CHLORIDE 0.9 % IV SOLN: INTRAVENOUS | Status: DC

## 2023-06-08 MED ORDER — DIPHENHYDRAMINE HCL 12.5 MG/5ML PO ELIX: 12.5000 mg | ORAL_SOLUTION | ORAL | Status: DC | PRN

## 2023-06-08 MED ORDER — BUPIVACAINE HCL (PF) 0.5 % IJ SOLN
INTRAMUSCULAR | Status: AC
  Filled 2023-06-08: qty 10

## 2023-06-08 MED ORDER — INSULIN ASPART 100 UNIT/ML IJ SOLN
INTRAMUSCULAR | Status: AC
  Filled 2023-06-08: qty 1

## 2023-06-08 MED ORDER — CHLORHEXIDINE GLUCONATE 0.12 % MT SOLN
OROMUCOSAL | Status: AC
  Filled 2023-06-08: qty 15

## 2023-06-08 MED ORDER — CEFAZOLIN SODIUM-DEXTROSE 2-4 GM/100ML-% IV SOLN
INTRAVENOUS | Status: AC
  Filled 2023-06-08: qty 100

## 2023-06-08 MED ORDER — METFORMIN HCL ER 500 MG PO TB24
500.0000 mg | ORAL_TABLET | Freq: Two times a day (BID) | ORAL | Status: DC
  Administered 2023-06-08 – 2023-06-09 (×2): 500 mg via ORAL

## 2023-06-08 MED ORDER — PRAVASTATIN SODIUM 20 MG PO TABS
20.0000 mg | ORAL_TABLET | ORAL | Status: DC
  Filled 2023-06-08: qty 1

## 2023-06-08 MED ORDER — LACTATED RINGERS IV SOLN: INTRAVENOUS | Status: DC | PRN

## 2023-06-08 MED ORDER — PANTOPRAZOLE SODIUM 40 MG PO TBEC
40.0000 mg | DELAYED_RELEASE_TABLET | Freq: Two times a day (BID) | ORAL | Status: DC
  Administered 2023-06-08 – 2023-06-09 (×2): 40 mg via ORAL
  Filled 2023-06-08 (×2): qty 1

## 2023-06-08 MED ORDER — METOCLOPRAMIDE HCL 10 MG PO TABS
10.0000 mg | ORAL_TABLET | Freq: Three times a day (TID) | ORAL | Status: DC
  Administered 2023-06-08 – 2023-06-09 (×2): 10 mg via ORAL
  Filled 2023-06-08 (×2): qty 1

## 2023-06-08 MED ORDER — FENTANYL CITRATE (PF) 100 MCG/2ML IJ SOLN
INTRAMUSCULAR | Status: DC | PRN
  Administered 2023-06-08: 25 ug via INTRAVENOUS
  Administered 2023-06-08: 50 ug via INTRAVENOUS

## 2023-06-08 MED ORDER — LIDOCAINE HCL URETHRAL/MUCOSAL 2 % EX GEL
CUTANEOUS | Status: AC
  Filled 2023-06-08: qty 6

## 2023-06-08 MED ORDER — FINASTERIDE 5 MG PO TABS
5.0000 mg | ORAL_TABLET | Freq: Every day | ORAL | Status: DC
  Administered 2023-06-08: 5 mg via ORAL
  Filled 2023-06-08: qty 1

## 2023-06-08 MED ORDER — ACETAMINOPHEN 10 MG/ML IV SOLN
INTRAVENOUS | Status: DC | PRN
Start: 2023-06-08 — End: 2023-06-08
  Administered 2023-06-08: 1000 mg via INTRAVENOUS

## 2023-06-08 MED ORDER — GABAPENTIN 300 MG PO CAPS
300.0000 mg | ORAL_CAPSULE | Freq: Once | ORAL | Status: AC
  Administered 2023-06-08: 300 mg via ORAL

## 2023-06-08 MED ORDER — OXYCODONE HCL 5 MG PO TABS: 10.0000 mg | ORAL_TABLET | ORAL | Status: DC | PRN

## 2023-06-08 MED ORDER — ONDANSETRON HCL 4 MG PO TABS: 4.0000 mg | ORAL_TABLET | Freq: Four times a day (QID) | ORAL | Status: DC | PRN

## 2023-06-08 SURGICAL SUPPLY — 68 items
ATTUNE MED DOME PAT 41 KNEE (Knees) IMPLANT
ATTUNE PS FEM LT SZ 7 CEM KNEE (Femur) IMPLANT
ATTUNE PSRP INSR SZ7 6 KNEE (Insert) IMPLANT
BASE TIBIAL ROT PLAT SZ 8 KNEE (Knees) IMPLANT
BATTERY INSTRU NAVIGATION (MISCELLANEOUS) ×4 IMPLANT
BIT DRILL QUICK REL 1/8 2PK SL (BIT) ×1 IMPLANT
BLADE CLIPPER SURG (BLADE) IMPLANT
BLADE SAW 70X12.5 (BLADE) ×1 IMPLANT
BLADE SAW 90X13X1.19 OSCILLAT (BLADE) ×1 IMPLANT
BLADE SAW 90X25X1.19 OSCILLAT (BLADE) ×1 IMPLANT
BONE CEMENT GENTAMICIN (Cement) ×2 IMPLANT
BRUSH SCRUB EZ PLAIN DRY (MISCELLANEOUS) ×1 IMPLANT
CEMENT BONE GENTAMICIN (Cement) ×2 IMPLANT
CEMENT BONE GENTAMICIN 40 (Cement) IMPLANT
COOLER POLAR GLACIER W/PUMP (MISCELLANEOUS) ×1 IMPLANT
CUFF TRNQT CYL 24X4X16.5-23 (TOURNIQUET CUFF) IMPLANT
CUFF TRNQT CYL 30X4X21-28X (TOURNIQUET CUFF) IMPLANT
DRAPE SHEET LG 3/4 BI-LAMINATE (DRAPES) ×1 IMPLANT
DRSG AQUACEL AG ADV 3.5X14 (GAUZE/BANDAGES/DRESSINGS) ×1 IMPLANT
DRSG MEPILEX SACRM 8.7X9.8 (GAUZE/BANDAGES/DRESSINGS) ×1 IMPLANT
DRSG TEGADERM 4X4.75 (GAUZE/BANDAGES/DRESSINGS) ×1 IMPLANT
DURAPREP 26ML APPLICATOR (WOUND CARE) ×2 IMPLANT
ELECT CAUTERY BLADE 6.4 (BLADE) ×1 IMPLANT
ELECT REM PT RETURN 9FT ADLT (ELECTROSURGICAL) ×1 IMPLANT
ELECTRODE REM PT RTRN 9FT ADLT (ELECTROSURGICAL) ×1 IMPLANT
EVACUATOR 1/8 PVC DRAIN (DRAIN) ×1 IMPLANT
EX-PIN ORTHOLOCK NAV 4X150 (PIN) ×2 IMPLANT
GAUZE XEROFORM 1X8 LF (GAUZE/BANDAGES/DRESSINGS) ×1 IMPLANT
GLOVE BIOGEL M STRL SZ7.5 (GLOVE) ×6 IMPLANT
GLOVE SURG UNDER POLY LF SZ8 (GLOVE) ×2 IMPLANT
GOWN STRL REUS W/ TWL LRG LVL3 (GOWN DISPOSABLE) ×1 IMPLANT
GOWN STRL REUS W/ TWL XL LVL3 (GOWN DISPOSABLE) ×1 IMPLANT
GOWN TOGA ZIPPER T7+ PEEL AWAY (MISCELLANEOUS) ×1 IMPLANT
HOLDER FOLEY CATH W/STRAP (MISCELLANEOUS) ×1 IMPLANT
HOOD PEEL AWAY T7 (MISCELLANEOUS) ×1 IMPLANT
KIT TURNOVER KIT A (KITS) ×1 IMPLANT
KNIFE SCULPS 14X20 (INSTRUMENTS) ×1 IMPLANT
MANIFOLD NEPTUNE II (INSTRUMENTS) ×2 IMPLANT
NDL SPNL 20GX3.5 QUINCKE YW (NEEDLE) ×2 IMPLANT
NEEDLE SPNL 20GX3.5 QUINCKE YW (NEEDLE) ×2 IMPLANT
PACK TOTAL KNEE (MISCELLANEOUS) ×1 IMPLANT
PAD ABD DERMACEA PRESS 5X9 (GAUZE/BANDAGES/DRESSINGS) ×2 IMPLANT
PAD ARMBOARD POSITIONER FOAM (MISCELLANEOUS) ×3 IMPLANT
PAD WRAPON POLAR KNEE (MISCELLANEOUS) ×1 IMPLANT
PENCIL SMOKE EVACUATOR COATED (MISCELLANEOUS) ×1 IMPLANT
PIN DRILL FIX HALF THREAD (BIT) ×2 IMPLANT
PIN FIXATION 1/8DIA X 3INL (PIN) ×1 IMPLANT
PULSAVAC PLUS IRRIG FAN TIP (DISPOSABLE) ×1 IMPLANT
SOL .9 NS 3000ML IRR UROMATIC (IV SOLUTION) ×1 IMPLANT
SOLUTION IRRIG SURGIPHOR (IV SOLUTION) ×1 IMPLANT
SPONGE DRAIN TRACH 4X4 STRL 2S (GAUZE/BANDAGES/DRESSINGS) ×1 IMPLANT
STAPLER SKIN PROX 35W (STAPLE) ×1 IMPLANT
STOCKINETTE IMPERV 14X48 (MISCELLANEOUS) ×1 IMPLANT
STOCKINETTE STRL BIAS CUT 8X4 (MISCELLANEOUS) ×1 IMPLANT
STRAP TIBIA SHORT (MISCELLANEOUS) ×1 IMPLANT
SUCTION TUBE FRAZIER 10FR DISP (SUCTIONS) ×1 IMPLANT
SUT VIC AB 0 CT1 36 (SUTURE) ×1 IMPLANT
SUT VIC AB 1 CT1 36 (SUTURE) ×2 IMPLANT
SUT VIC AB 2-0 CT2 27 (SUTURE) ×1 IMPLANT
SYR 30ML LL (SYRINGE) ×2 IMPLANT
TIBIAL BASE ROT PLAT SZ 8 KNEE (Knees) ×1 IMPLANT
TIP FAN IRRIG PULSAVAC PLUS (DISPOSABLE) ×1 IMPLANT
TOWEL OR 17X26 4PK STRL BLUE (TOWEL DISPOSABLE) ×1 IMPLANT
TOWER CARTRIDGE SMART MIX (DISPOSABLE) ×1 IMPLANT
TRAP FLUID SMOKE EVACUATOR (MISCELLANEOUS) ×1 IMPLANT
TRAY FOLEY MTR SLVR 16FR STAT (SET/KITS/TRAYS/PACK) ×1 IMPLANT
WATER STERILE IRR 1000ML POUR (IV SOLUTION) ×1 IMPLANT
WRAPON POLAR PAD KNEE (MISCELLANEOUS) ×1 IMPLANT

## 2023-06-08 NOTE — Progress Notes (Signed)
 Patient is not able to walk the distance required to go the bathroom, or he/she is unable to safely negotiate stairs required to access the bathroom.  A 3in1 BSC will alleviate this problem   Amenda Duclos P. Angie Fava M.D.

## 2023-06-08 NOTE — Anesthesia Preprocedure Evaluation (Signed)
 Anesthesia Evaluation  Patient identified by MRN, date of birth, ID band Patient awake    Reviewed: Allergy & Precautions, H&P , NPO status , Patient's Chart, lab work & pertinent test results, reviewed documented beta blocker date and time   Airway Mallampati: II   Neck ROM: full    Dental  (+) Poor Dentition   Pulmonary COPD, former smoker   Pulmonary exam normal        Cardiovascular Exercise Tolerance: Good hypertension, On Medications negative cardio ROS Normal cardiovascular exam Rhythm:regular Rate:Normal     Neuro/Psych negative neurological ROS  negative psych ROS   GI/Hepatic Neg liver ROS,GERD  Medicated,,  Endo/Other  negative endocrine ROSdiabetes, Well Controlled, Type 2, Oral Hypoglycemic Agents    Renal/GU negative Renal ROS  negative genitourinary   Musculoskeletal   Abdominal   Peds  Hematology negative hematology ROS (+)   Anesthesia Other Findings Past Medical History: No date: AAA (abdominal aortic aneurysm) (HCC) No date: Adrenal adenoma, left No date: Arrhythmia No date: Diabetes mellitus without complication (HCC) No date: GERD (gastroesophageal reflux disease) No date: Hypertension No date: Lung nodule No date: Osteoarthritis Past Surgical History: No date: FOOT SURGERY; Bilateral No date: KNEE ARTHROSCOPY No date: SIGMOIDECTOMY No date: TOOTH EXTRACTION BMI    Body Mass Index: 26.10 kg/m     Reproductive/Obstetrics negative OB ROS                             Anesthesia Physical Anesthesia Plan  ASA: 3  Anesthesia Plan: Spinal   Post-op Pain Management:    Induction:   PONV Risk Score and Plan: 2 and Ondansetron, Dexamethasone, Propofol infusion, TIVA and Midazolam  Airway Management Planned: Natural Airway and Nasal Cannula  Additional Equipment:   Intra-op Plan:   Post-operative Plan:   Informed Consent: I have reviewed the patients  History and Physical, chart, labs and discussed the procedure including the risks, benefits and alternatives for the proposed anesthesia with the patient or authorized representative who has indicated his/her understanding and acceptance.     Dental Advisory Given  Plan Discussed with: CRNA  Anesthesia Plan Comments: (Patient reports no bleeding problems and no anticoagulant use.  Plan for spinal with backup GA  Patient consented for risks of anesthesia including but not limited to:  - adverse reactions to medications - damage to eyes, teeth, lips or other oral mucosa - nerve damage due to positioning  - risk of bleeding, infection and or nerve damage from spinal that could lead to paralysis - risk of headache or failed spinal - damage to teeth, lips or other oral mucosa - sore throat or hoarseness - damage to heart, brain, nerves, lungs, other parts of body or loss of life  Patient voiced understanding and assent.)       Anesthesia Quick Evaluation

## 2023-06-08 NOTE — Anesthesia Procedure Notes (Signed)
 Procedure Name: MAC Date/Time: 06/08/2023 11:15 AM  Performed by: Lily Lovings, CRNAPre-anesthesia Checklist: Patient identified, Emergency Drugs available, Suction available and Patient being monitored Patient Re-evaluated:Patient Re-evaluated prior to induction Oxygen Delivery Method: Simple face mask Preoxygenation: Pre-oxygenation with 100% oxygen Induction Type: IV induction

## 2023-06-08 NOTE — Transfer of Care (Signed)
 Immediate Anesthesia Transfer of Care Note  Patient: Ernest Brown  Procedure(s) Performed: ARTHROPLASTY, KNEE, TOTAL, USING IMAGELESS COMPUTER-ASSISTED NAVIGATION (Left: Knee)  Patient Location: PACU  Anesthesia Type:MAC and Spinal  Level of Consciousness: sedated  Airway & Oxygen Therapy: Patient Spontanous Breathing and Patient connected to face mask oxygen  Post-op Assessment: Report given to RN and Post -op Vital signs reviewed and stable  Post vital signs: stable  Last Vitals:  Vitals Value Taken Time  BP 99/58 06/08/23 1519  Temp 97.7   Pulse 69 06/08/23 1523  Resp 11 06/08/23 1523  SpO2 100 % 06/08/23 1523  Vitals shown include unfiled device data.  Last Pain:  Vitals:   06/08/23 0940  TempSrc: Temporal  PainSc: 0-No pain         Complications: No notable events documented.

## 2023-06-08 NOTE — Op Note (Signed)
 OPERATIVE NOTE  DATE OF SURGERY:  06/08/2023  PATIENT NAME:  Ernest Brown   DOB: Apr 07, 1950  MRN: [id number]  PRE-OPERATIVE DIAGNOSIS: Degenerative arthrosis of the left knee, primary  POST-OPERATIVE DIAGNOSIS:  Same  PROCEDURE:  Left total knee arthroplasty using computer-assisted navigation  SURGEON:  Jena Gauss. M.D.  ASSISTANT:  Gean Birchwood, PA-C (present and scrubbed throughout the case, critical for assistance with exposure, retraction, instrumentation, and closure)  ANESTHESIA: spinal  ESTIMATED BLOOD LOSS: 50 mL  FLUIDS REPLACED: 1200 mL of crystalloid  TOURNIQUET TIME: 103 minutes  DRAINS: 2 medium Hemovac drains  SOFT TISSUE RELEASES: Anterior cruciate ligament, posterior cruciate ligament, deep medial collateral ligament, patellofemoral ligament  IMPLANTS UTILIZED: DePuy Attune size 7 posterior stabilized femoral component (cemented), size 8 rotating platform tibial component (cemented), 41 mm medialized dome patella (cemented), and a 6 mm stabilized rotating platform polyethylene insert.  INDICATIONS FOR SURGERY: Ernest Brown is a 73 y.o. year old male with a long history of progressive knee pain. X-rays demonstrated severe degenerative changes in tricompartmental fashion. The patient had not seen any significant improvement despite conservative nonsurgical intervention. After discussion of the risks and benefits of surgical intervention, the patient expressed understanding of the risks benefits and agree with plans for total knee arthroplasty.   The risks, benefits, and alternatives were discussed at length including but not limited to the risks of infection, bleeding, nerve injury, stiffness, blood clots, the need for revision surgery, cardiopulmonary complications, among others, and they were willing to proceed.  PROCEDURE IN DETAIL: The patient was brought into the operating room and, after adequate spinal anesthesia was achieved, a tourniquet was  placed on the patient's upper thigh. The patient's knee and leg were cleaned and prepped with alcohol and DuraPrep and draped in the usual sterile fashion. A "timeout" was performed as per usual protocol. The lower extremity was exsanguinated using an Esmarch, and the tourniquet was inflated to 300 mmHg. An anterior longitudinal incision was made followed by a standard mid vastus approach. The deep fibers of the medial collateral ligament were elevated in a subperiosteal fashion off of the medial flare of the tibia so as to maintain a continuous soft tissue sleeve. The patella was subluxed laterally and the patellofemoral ligament was incised. Inspection of the knee demonstrated severe degenerative changes with full-thickness loss of articular cartilage. Osteophytes were debrided using a rongeur. Anterior and posterior cruciate ligaments were excised. Two 4.0 mm Schanz pins were inserted in the femur and into the tibia for attachment of the array of trackers used for computer-assisted navigation. Hip center was identified using a circumduction technique. Distal landmarks were mapped using the computer. The distal femur and proximal tibia were mapped using the computer. The distal femoral cutting guide was positioned using computer-assisted navigation so as to achieve a 5 distal valgus cut. The femur was sized and it was felt that a size 7 femoral component was appropriate. A size 7 femoral cutting guide was positioned and the anterior cut was performed and verified using the computer. This was followed by completion of the posterior and chamfer cuts. Femoral cutting guide for the central box was then positioned in the center box cut was performed.  Attention was then directed to the proximal tibia. Medial and lateral menisci were excised. The extramedullary tibial cutting guide was positioned using computer-assisted navigation so as to achieve a 0 varus-valgus alignment and 3 posterior slope. The cut was  performed and verified using the computer. The proximal tibia  was sized and it was felt that a size 8 tibial tray was appropriate. Tibial and femoral trials were inserted followed by insertion of a 6 mm polyethylene insert. This allowed for excellent mediolateral soft tissue balancing both in flexion and in full extension. Finally, the patella was cut and prepared so as to accommodate a 41 mm medialized dome patella. A patella trial was placed and the knee was placed through a range of motion with excellent patellar tracking appreciated. The femoral trial was removed after debridement of posterior osteophytes. The central post-hole for the tibial component was reamed followed by insertion of a keel punch. Tibial trials were then removed. Cut surfaces of bone were irrigated with copious amounts of normal saline using pulsatile lavage and then suctioned dry. Polymethylmethacrylate cement with gentamicin was prepared in the usual fashion using a vacuum mixer. Cement was applied to the cut surface of the proximal tibia as well as along the undersurface of a size 8 rotating platform tibial component. Tibial component was positioned and impacted into place. Excess cement was removed using Personal assistant. Cement was then applied to the cut surfaces of the femur as well as along the posterior flanges of the size 7 femoral component. The femoral component was positioned and impacted into place. Excess cement was removed using Personal assistant. A 6 mm polyethylene trial was inserted and the knee was brought into full extension with steady axial compression applied. Finally, cement was applied to the backside of a 41 mm medialized dome patella and the patellar component was positioned and patellar clamp applied. Excess cement was removed using Personal assistant. After adequate curing of the cement, the tourniquet was deflated after a total tourniquet time of 103 minutes. Hemostasis was achieved using electrocautery. The knee was  irrigated with copious amounts of normal saline using pulsatile lavage followed by 450 ml of Surgiphor and then suctioned dry. 20 mL of 1.3% Exparel and 60 mL of 0.25% Marcaine in 40 mL of normal saline was injected along the posterior capsule, medial and lateral gutters, and along the arthrotomy site. A 6 mm stabilized rotating platform polyethylene insert was inserted and the knee was placed through a range of motion with excellent mediolateral soft tissue balancing appreciated and excellent patellar tracking noted. 2 medium drains were placed in the wound bed and brought out through separate stab incisions. The medial parapatellar portion of the incision was reapproximated using interrupted sutures of #1 Vicryl. Subcutaneous tissue was approximated in layers using first #0 Vicryl followed #2-0 Vicryl. The skin was approximated with skin staples. A sterile dressing was applied.  The patient tolerated the procedure well and was transported to the recovery room in stable condition.    Ernest Brown., M.D.

## 2023-06-08 NOTE — Progress Notes (Signed)
 Subjective: 1 Day Post-Op Procedure(s) (LRB): ARTHROPLASTY, KNEE, TOTAL, USING IMAGELESS COMPUTER-ASSISTED NAVIGATION (Left) Patient reports pain as mild.   Patient seen in rounds with Dr. Ernest Pine. Patient is well, and has had no acute complaints or problems. Denies any CP, SOB, N/V, fevers or chills We will start therapy today.  Plan is to go Home after hospital stay.  Objective: Vital signs in last 24 hours: Temp:  [97.6 F (36.4 C)-98.1 F (36.7 C)] 97.9 F (36.6 C) (04/10 0800) Pulse Rate:  [69-93] 73 (04/10 0732) Resp:  [10-21] 16 (04/10 0732) BP: (94-156)/(58-84) 140/71 (04/10 0732) SpO2:  [94 %-100 %] 96 % (04/10 0732)  Intake/Output from previous day:  Intake/Output Summary (Last 24 hours) at 06/09/2023 0947 Last data filed at 06/09/2023 0737 Gross per 24 hour  Intake 1968.32 ml  Output 620 ml  Net 1348.32 ml    Intake/Output this shift: Total I/O In: -  Out: 100 [Drains:100]  Labs: No results for input(s): "HGB" in the last 72 hours. No results for input(s): "WBC", "RBC", "HCT", "PLT" in the last 72 hours. No results for input(s): "NA", "K", "CL", "CO2", "BUN", "CREATININE", "GLUCOSE", "CALCIUM" in the last 72 hours. No results for input(s): "LABPT", "INR" in the last 72 hours.  EXAM General - Patient is Alert, Appropriate, and Oriented Extremity - Neurologically intact Neurovascular intact Sensation intact distally Intact pulses distally Dorsiflexion/Plantar flexion intact No cellulitis present Compartment soft Dressing - dressing C/D/I and no drainage Motor Function - intact, moving foot and toes well on exam. JP Drain pulled without difficulty. Intact  Past Medical History:  Diagnosis Date   AAA (abdominal aortic aneurysm) (HCC)    Adrenal adenoma, left    Aortic atherosclerosis (HCC)    Arrhythmia    DM (diabetes mellitus), type 2 (HCC)    Emphysema of lung (HCC)    Former cigarette smoker    GERD (gastroesophageal reflux disease)    Hepatic  steatosis    Hypertension    Lung nodule    Osteoarthritis of both knees     Assessment/Plan: 1 Day Post-Op Procedure(s) (LRB): ARTHROPLASTY, KNEE, TOTAL, USING IMAGELESS COMPUTER-ASSISTED NAVIGATION (Left) Principal Problem:   History of total knee arthroplasty, left  Estimated body mass index is 25.31 kg/m as calculated from the following:   Height as of this encounter: 6\' 2"  (1.88 m).   Weight as of this encounter: 89.4 kg. Advance diet Up with therapy  Patient will continue to work with physical therapy to pass postoperative PT protocols, ROM and strengthening  Discussed with the patient continuing to utilize Polar Care  Patient will use bone foam in 20-30 minute intervals  Patient will wear TED hose bilaterally to help prevent DVT and clot formation  Discussed the Aquacel bandage.  This bandage will stay in place 7 days postoperatively.  Can be replaced with honeycomb bandages that will be sent home with the patient  Discussed sending the patient home with tramadol and oxycodone for as needed pain management.  Patient will also be sent home with Celebrex to help with swelling and inflammation.  Patient will take an 81 mg aspirin twice daily for DVT prophylaxis  JP drain removed without difficulty, intact  Weight-Bearing as tolerated to left leg  Patient will follow-up with [facility] in 2 weeks for staple removal and reevaluation  Rayburn Go, PA-C [facility] 06/09/2023, 9:47 AM

## 2023-06-08 NOTE — Anesthesia Procedure Notes (Addendum)
 Spinal  Patient location during procedure: OR Start time: 06/08/2023 11:25 AM End time: 06/08/2023 11:33 AM Reason for block: surgical anesthesia Staffing Performed: anesthesiologist and resident/CRNA  Anesthesiologist: Stephanie Coup, MD Performed by: Lily Lovings, CRNA Authorized by: Stephanie Coup, MD   Preanesthetic Checklist Completed: patient identified, IV checked, site marked, risks and benefits discussed, surgical consent, monitors and equipment checked, pre-op evaluation and timeout performed Spinal Block Patient position: sitting Prep: Betadine Patient monitoring: heart rate, continuous pulse ox, blood pressure and cardiac monitor Approach: midline Location: L3-4 Injection technique: single-shot Needle Needle type: Whitacre and Introducer  Needle gauge: 22 G Needle length: 9 cm Assessment Events: CSF return Additional Notes Negative paresthesia. Negative blood return. Positive free-flowing CSF. Expiration date of kit checked and confirmed. Patient tolerated procedure well, without complications.

## 2023-06-08 NOTE — Interval H&P Note (Signed)
 History and Physical Interval Note:  06/08/2023 10:48 AM  Ernest Brown  has presented today for surgery, with the diagnosis of PRIMARY OSTEOARTHRITIS OF LEFT KNEE..  The various methods of treatment have been discussed with the patient and family. After consideration of risks, benefits and other options for treatment, the patient has consented to  Procedure(s): ARTHROPLASTY, KNEE, TOTAL, USING IMAGELESS COMPUTER-ASSISTED NAVIGATION (Left) as a surgical intervention.  The patient's history has been reviewed, patient examined, no change in status, stable for surgery.  I have reviewed the patient's chart and labs.  Questions were answered to the patient's satisfaction.     Shirline Kendle P Kdyn Vonbehren

## 2023-06-09 ENCOUNTER — Encounter: Payer: Self-pay | Admitting: Orthopedic Surgery

## 2023-06-09 DIAGNOSIS — M1712 Unilateral primary osteoarthritis, left knee: Secondary | ICD-10-CM | POA: Diagnosis not present

## 2023-06-09 LAB — GLUCOSE, CAPILLARY: Glucose-Capillary: 179 mg/dL — ABNORMAL HIGH (ref 70–99)

## 2023-06-09 MED ORDER — OXYCODONE HCL 5 MG PO TABS: 5.0000 mg | ORAL_TABLET | ORAL | 0 refills | Status: AC | PRN

## 2023-06-09 MED ORDER — METFORMIN HCL ER 500 MG PO TB24
ORAL_TABLET | ORAL | Status: AC
  Filled 2023-06-09: qty 1

## 2023-06-09 MED ORDER — CELECOXIB 200 MG PO CAPS: 200.0000 mg | ORAL_CAPSULE | Freq: Two times a day (BID) | ORAL | 1 refills | Status: AC

## 2023-06-09 MED ORDER — TRAMADOL HCL 50 MG PO TABS: 50.0000 mg | ORAL_TABLET | ORAL | 0 refills | Status: AC | PRN

## 2023-06-09 NOTE — Plan of Care (Signed)
  Problem: Pain Managment: Goal: General experience of comfort will improve and/or be controlled Outcome: Progressing   Problem: Safety: Goal: Ability to remain free from injury will improve 06/09/2023 0945 by Wilfred Lacy, RN Outcome: Progressing 06/09/2023 0802 by Wilfred Lacy, RN Outcome: Progressing   Problem: Activity: Goal: Ability to avoid complications of mobility impairment will improve Outcome: Progressing   Problem: Pain Management: Goal: Pain level will decrease with appropriate interventions 06/09/2023 0945 by Wilfred Lacy, RN Outcome: Progressing 06/09/2023 0802 by Wilfred Lacy, RN Outcome: Progressing

## 2023-06-09 NOTE — TOC Initial Note (Signed)
 Transition of Care [facility]) - Initial/Assessment Note    Patient Details  Name: Ernest Brown MRN: [id number] Date of Birth: 11/18/1950  [facility] CM/SW Contact:    Marlowe Sax, RN Phone Number: 06/09/2023, 9:37 AM  Clinical Narrative:                  Patient lives at home with spouse, He was set up with [facility] services thru [facility] by the surgeons office prior to surgery Has DME at home  Expected Discharge Plan: Home w Home Health Services Barriers to Discharge: Barriers Resolved   Patient Goals and CMS Choice            Expected Discharge Plan and Services   Discharge Planning Services: CM Consult   Living arrangements for the past 2 months: Single Family Home                 DME Arranged: N/A DME Agency: NA       HH Arranged: PT, OT HH Agency: [facility] Date HH Agency Contacted: 06/09/23 Time HH Agency Contacted: [phone] Representative spoke with at [facility] Agency: [place]  Prior Living Arrangements/Services Living arrangements for the past 2 months: Single Family Home Lives with:: Spouse Patient language and need for interpreter reviewed:: Yes Do you feel safe going back to the place where you live?: Yes      Need for Family Participation in Patient Care: Yes (Comment) Care giver support system in place?: Yes (comment) Current home services: DME (BSC/3in1;Rolling Walker (2 wheels)) Criminal Activity/Legal Involvement Pertinent to Current Situation/Hospitalization: No - Comment as needed  Activities of Daily Living   ADL Screening (condition at time of admission) Independently performs ADLs?: Yes (appropriate for developmental age) Is the patient deaf or have difficulty hearing?: No Does the patient have difficulty seeing, even when wearing glasses/contacts?: No Does the patient have difficulty concentrating, remembering, or making decisions?: No  Permission Sought/Granted   Permission granted to share information with : Yes,  Verbal Permission Granted              Emotional Assessment Appearance:: Appears stated age Attitude/Demeanor/Rapport: Engaged Affect (typically observed): Pleasant Orientation: : Oriented to Self, Oriented to Place, Oriented to  Time, Oriented to Situation Alcohol / Substance Use: Not Applicable Psych Involvement: No (comment)  Admission diagnosis:  Primary osteoarthritis of left knee [M17.12] History of total knee arthroplasty, left [G64.403] Patient Active Problem List   Diagnosis Date Noted   History of total knee arthroplasty, left 06/08/2023   History of total knee arthroplasty, right 03/04/2023   Bursitis of hip 02/27/2023   Gastroesophageal reflux disease without esophagitis 08/09/2022   Personal history of other malignant neoplasm of skin 10/26/2018   Centrilobular emphysema (HCC) 09/26/2017   AAA (abdominal aortic aneurysm) without rupture (HCC) 02/03/2017   Essential hypertension 02/03/2017   Tobacco use disorder 02/03/2017   Adrenal adenoma, left 12/31/2016   Lung nodule 12/29/2016   Primary osteoarthritis of knees, bilateral 07/21/2016   Primary osteoarthritis of left knee 07/21/2016   Type 2 diabetes mellitus with circulatory disorder, without long-term current use of insulin (HCC) 01/28/2014   PCP:  Lauro Regulus, MD Pharmacy:   [facility] [phone] [place] [address], [place] - [address] AT [facility] [address] [place] [address] [place] [address] Phone: [phone] Fax: [phone]     Social Drivers of Health (SDOH) Social History: SDOH Screenings   Food Insecurity: No Food  Insecurity (06/08/2023)  Housing: Low Risk  (06/08/2023)  Transportation Needs: No Transportation Needs (06/08/2023)  Utilities: Not At Risk (06/08/2023)  Depression (PHQ2-9): Low Risk  (05/07/2019)  Financial Resource Strain: Low Risk  (10/14/2022)   Received from [facility]  Social Connections: Moderately Isolated (06/08/2023)  Tobacco Use: Medium  Risk (06/08/2023)   SDOH Interventions:     Readmission Risk Interventions     No data to display

## 2023-06-09 NOTE — Progress Notes (Signed)
 Physical Therapy Evaluation Patient Details Name: Ernest Brown MRN: [id number] DOB: Nov 11, 1950 Today's Date: 06/09/2023  History of Present Illness  Pt is s/p L knee total arthroplasty on 06/08/23, currently WBAT. PMH significant for R TKA 03/04/23, arrhythmia, DM, GERD, OA, tobacco use, HTN, & abdominal aortic aneurysm  Clinical Impression  Pt admitted with above diagnosis. Pt currently with functional limitations due to the deficits listed below (see PT Problem List). Pt received supine in bed agreeable to PT with spouse at bedside. Reports PTA being independent. Has had excellent outcomes with prior R TKA.   To date, began session reviewing knee precautions and providing HEP with education on reps/sets/frequency. Pt ranging from independent to mod-I with bed mobility, STS, gait, and performing stairs. Pt with excellent understanding of heel strike at initial contact, safe STS efforts with hand placement and excellent comprehension with LE sequencing and single rail use with steps. Pt returns to EOB and supine for LE therex as listed below. Knee AROM currently 2-90 degrees. Pt left in care of OT with hand off. Pt has met all PT goals and appropriate for D/c with no acute needs. Recommend normal and expected f/u recs as listed. RN notified.      If plan is discharge home, recommend the following: Assist for transportation   Can travel by private vehicle        Equipment Recommendations None recommended by PT  Recommendations for Other Services       Functional Status Assessment Patient has had a recent decline in their functional status and demonstrates the ability to make significant improvements in function in a reasonable and predictable amount of time.     Precautions / Restrictions Precautions Precautions: Knee Precaution Booklet Issued: Yes (comment) Recall of Precautions/Restrictions: Intact Restrictions Weight Bearing Restrictions Per Provider Order: Yes LLE Weight Bearing  Per Provider Order: Weight bearing as tolerated      Mobility  Bed Mobility Overal bed mobility: Independent               Patient Response: Cooperative  Transfers Overall transfer level: Modified independent Equipment used: Rolling walker (2 wheels)               General transfer comment: Safe hand placement    Ambulation/Gait Ambulation/Gait assistance: Modified independent (Device/Increase time) Gait Distance (Feet): 220 Feet Assistive device: Rolling walker (2 wheels) Gait Pattern/deviations: Step-through pattern, Decreased step length - right, Decreased step length - left, Decreased stance time - left       General Gait Details: Mildly antalgic gait throughout but good cadence. Maintains consistent L heel strike without cuing.  Stairs Stairs: Yes Stairs assistance: Modified independent (Device/Increase time) Stair Management: One rail Right, One rail Left, Step to pattern, Forwards Number of Stairs: 4 General stair comments: asc/desc with good understanding of LE sequencing. Asc using L railing and desc using R railing.  Wheelchair Mobility     Tilt Bed Tilt Bed Patient Response: Cooperative  Modified Rankin (Stroke Patients Only)       Balance Overall balance assessment: Needs assistance Sitting-balance support: No upper extremity supported, Feet supported Sitting balance-Leahy Scale: Normal       Standing balance-Leahy Scale: Fair Standing balance comment: can maintain static standing without UE support                             Pertinent Vitals/Pain Pain Assessment Pain Assessment: Faces Faces Pain Scale: No hurt  Home Living Family/patient expects to be discharged to:: Private residence Living Arrangements: Spouse/significant other Available Help at Discharge: Family Type of Home: House Home Access: Stairs to enter Entrance Stairs-Rails: Doctor, general practice of Steps: 3   Home Layout: One  level Home Equipment: Teacher, English as a foreign language (2 wheels)      Prior Function Prior Level of Function : Independent/Modified Independent                     Extremity/Trunk Assessment   Upper Extremity Assessment Upper Extremity Assessment: Overall WFL for tasks assessed    Lower Extremity Assessment Lower Extremity Assessment: Defer to PT evaluation LLE Deficits / Details: expected ROM deficits from surgery       Communication   Communication Communication: No apparent difficulties    Cognition Arousal: Alert Behavior During Therapy: WFL for tasks assessed/performed, Impulsive   PT - Cognitive impairments: No apparent impairments                         Following commands: Intact       Cueing Cueing Techniques: Verbal cues     General Comments      Exercises Total Joint Exercises Quad Sets: AROM, Strengthening, Left, 10 reps, Supine Short Arc Quad: AROM, Strengthening, Left, 10 reps, Supine Heel Slides: AROM, Strengthening, Left, 10 reps, Seated Hip ABduction/ADduction: AROM, Strengthening, Left, Supine, 10 reps Straight Leg Raises: AROM, Strengthening, Left, 5 reps, Supine Long Arc Quad: AROM, Strengthening, Left, 10 reps, Seated Goniometric ROM: 2-90 Other Exercises Other Exercises: Role of PT in acute setting, d/c recs, WB status, LE positioning to prevent knee flexion contracture, HEP hand out (reps/sets/frequency)   Assessment/Plan    PT Assessment All further PT needs can be met in the next venue of care  PT Problem List Decreased strength;Decreased range of motion       PT Treatment Interventions      PT Goals (Current goals can be found in the Care Plan section)  Acute Rehab PT Goals Patient Stated Goal: to go home PT Goal Formulation: With patient Time For Goal Achievement: 06/23/23 Potential to Achieve Goals: Good    Frequency       Co-evaluation               AM-PAC PT "6 Clicks" Mobility  Outcome Measure  Help needed turning from your back to your side while in a flat bed without using bedrails?: None Help needed moving from lying on your back to sitting on the side of a flat bed without using bedrails?: None Help needed moving to and from a bed to a chair (including a wheelchair)?: A Little Help needed standing up from a chair using your arms (e.g., wheelchair or bedside chair)?: A Little Help needed to walk in hospital room?: A Little Help needed climbing 3-5 steps with a railing? : A Little 6 Click Score: 20    End of Session Equipment Utilized During Treatment: Gait belt Activity Tolerance: Patient tolerated treatment well Patient left: in bed;with call bell/phone within reach;Other (comment) (Hand off to OT) Nurse Communication: Mobility status PT Visit Diagnosis: Muscle weakness (generalized) (M62.81)    Time: 1478-2956 PT Time Calculation (min) (ACUTE ONLY): 21 min   Charges:   PT Evaluation $PT Eval Low Complexity: 1 Low   PT General Charges $$ ACUTE PT VISIT: 1 Visit        Juliette Standre M. Fairly IV, PT, DPT Physical Therapist- [place]  [facility]  06/09/2023, 9:27 AM

## 2023-06-09 NOTE — Evaluation (Signed)
 Occupational Therapy Evaluation Patient Details Name: Ernest Brown MRN: [id number] DOB: 06/23/1950 Today's Date: 06/09/2023   History of Present Illness   Pt is s/p L knee total arthroplasty on 06/08/23, currently WBAT. PMH significant for R TKA 03/04/23, arrhythmia, DM, GERD, OA, tobacco use, HTN, & abdominal aortic aneurysm     Clinical Impressions Pt seen for OT evaluation this date, POD#1 from above surgery. Pt was independent in all ADLs/mobility prior to surgery. Pt is eager to return to PLOF with less pain and improved safety and independence. Spouse present during OT session. Pt currently requires minimal assist for LB dressing while in seated position due to pain and limited AROM of L knee. Pt attempts to perform dressing while on one leg and is difficult to redirect. Pt impulsive and presents with decreased safety awareness, and is generally dismissive of education provided. CGA progressing to supervision for functional mobility t/f hallway bathroom using RW for toilet transfers.   Pt instructed in polar care mgt, falls prevention strategies, home/routines modifications, DME/AE for LB bathing and dressing tasks, and compression stocking mgt. Pt would benefit from skilled OT services including additional instruction in dressing techniques with or without assistive devices for dressing and bathing skills to support recall and carryover prior to discharge and ultimately to maximize safety, independence, and minimize falls risk and caregiver burden. Do not currently anticipate any OT needs following this hospitalization. Pt left supine with PA present.      If plan is discharge home, recommend the following:   A little help with walking and/or transfers;A little help with bathing/dressing/bathroom;Assist for transportation     Functional Status Assessment   Patient has had a recent decline in their functional status and demonstrates the ability to make significant improvements in  function in a reasonable and predictable amount of time.     Equipment Recommendations   None recommended by OT      Precautions/Restrictions   Precautions Precautions: Knee Precaution Booklet Issued: Yes (comment) Recall of Precautions/Restrictions: Intact Restrictions Weight Bearing Restrictions Per Provider Order: Yes LLE Weight Bearing Per Provider Order: Weight bearing as tolerated     Mobility Bed Mobility Overal bed mobility: Independent                  Transfers Overall transfer level: Modified independent Equipment used: Rolling walker (2 wheels)                      Balance Overall balance assessment: Needs assistance Sitting-balance support: No upper extremity supported, Feet supported Sitting balance-Leahy Scale: Normal       Standing balance-Leahy Scale: Fair Standing balance comment: requires unilateral UE support for stability                           ADL either performed or assessed with clinical judgement   ADL Overall ADL's : Needs assistance/impaired     Grooming: Supervision/safety;Standing           Upper Body Dressing : Sitting;Supervision/safety   Lower Body Dressing: Sit to/from stand;Minimal assistance Lower Body Dressing Details (indicate cue type and reason): pt impulsive, attempting to stand on one leg while donning boxers over surgical leg. dismissive of OT attempts to educate and redirect to a seated position, insisting on "I can do it". No LOB, but decreased safety awareness Toilet Transfer: Supervision/safety;Contact guard assist;BSC/3in1;Rolling walker (2 wheels);Ambulation Toilet Transfer Details (indicate cue type and reason): reg toilet, use of  GB Toileting- Clothing Manipulation and Hygiene: Supervision/safety;Sit to/from stand       Functional mobility during ADLs: Supervision/safety;Rolling walker (2 wheels) General ADL Comments: pt impulsive, dismissive of OT redirection and education  of falls precations.      Pertinent Vitals/Pain Pain Assessment Pain Assessment: 0-10 Pain Score: 5  Pain Location: surgical knee Pain Descriptors / Indicators: Discomfort, Dull Pain Intervention(s): Limited activity within patient's tolerance, Repositioned     Extremity/Trunk Assessment Upper Extremity Assessment Upper Extremity Assessment: Overall WFL for tasks assessed   Lower Extremity Assessment Lower Extremity Assessment: Defer to PT evaluation LLE Deficits / Details: expected ROM deficits from surgery       Communication Communication Communication: No apparent difficulties   Cognition Arousal: Alert Behavior During Therapy: Impulsive                                 Following commands: Intact       Cueing  General Comments   Cueing Techniques: Verbal cues      Exercises Other Exercises Other Exercises: Edu on falls prevention, polar care, compression sock donning/doffing, functional transfers, ADL performance        Home Living Family/patient expects to be discharged to:: Private residence Living Arrangements: Spouse/significant other Available Help at Discharge: Family Type of Home: House Home Access: Stairs to enter Secretary/administrator of Steps: 3 Entrance Stairs-Rails: Right;Left Home Layout: One level     Bathroom Shower/Tub: Chief Strategy Officer: Handicapped height Bathroom Accessibility: Yes   Home Equipment: Teacher, English as a foreign language (2 wheels)          Prior Functioning/Environment Prior Level of Function : Independent/Modified Independent                    OT Problem List: Decreased range of motion;Decreased strength;Impaired balance (sitting and/or standing);Decreased safety awareness;Decreased knowledge of use of DME or AE;Decreased knowledge of precautions   OT Treatment/Interventions: Self-care/ADL training;DME and/or AE instruction;Therapeutic activities;Patient/family education;Balance  training      OT Goals(Current goals can be found in the care plan section)   Acute Rehab OT Goals OT Goal Formulation: With patient Time For Goal Achievement: 06/23/23 Potential to Achieve Goals: Fair   OT Frequency:  Min 2X/week       AM-PAC OT "6 Clicks" Daily Activity     Outcome Measure Help from another person eating meals?: None Help from another person taking care of personal grooming?: None Help from another person toileting, which includes using toliet, bedpan, or urinal?: A Little Help from another person bathing (including washing, rinsing, drying)?: A Little Help from another person to put on and taking off regular upper body clothing?: None Help from another person to put on and taking off regular lower body clothing?: A Little 6 Click Score: 21   End of Session Equipment Utilized During Treatment: Gait belt;Rolling walker (2 wheels) Nurse Communication: Mobility status  Activity Tolerance: Patient tolerated treatment well Patient left: in bed;with call bell/phone within reach;with family/visitor present  OT Visit Diagnosis: Other abnormalities of gait and mobility (R26.89)                Time: 3086-5784 OT Time Calculation (min): 21 min Charges:  OT General Charges $OT Visit: 1 Visit OT Evaluation $OT Eval Low Complexity: 1 Low OT Treatments $Self Care/Home Management : 8-22 mins  Ernest Brown L. Laquon Emel, OTR/L  06/09/23, 11:01 AM

## 2023-06-09 NOTE — Progress Notes (Signed)
 DISCHARGE NOTE:  Pt given discharge instructions, scripts and 2 honeycomb dressings. TED hose on both legs. Pt wheeled to car by staff, son providing transportation home.

## 2023-06-09 NOTE — Discharge Summary (Signed)
 Physician Discharge Summary  Subjective: 1 Day Post-Op Procedure(s) (LRB): ARTHROPLASTY, KNEE, TOTAL, USING IMAGELESS COMPUTER-ASSISTED NAVIGATION (Left) Patient reports pain as mild.   Patient seen in rounds with Dr. Ernest Pine. Patient is well, and has had no acute complaints or problems.  Denies any CP, SOB, N/V, fevers or chills. Patient has done well with physical therapy Patient is ready to go home  Physician Discharge Summary  Patient ID: Ernest Brown MRN: [id number] DOB/AGE: June 08, 1950 73 y.o.  Admit date: 06/08/2023 Discharge date: 06/09/2023  Admission Diagnoses:  Discharge Diagnoses:  Principal Problem:   History of total knee arthroplasty, left   Discharged Condition: Good  Hospital Course: Patient presented to the hospital on/10/2023 for an elective left total knee arthroplasty. Patient was given 1g of TXA and 2g of Ancef prior to the procedure. he  tolerated the procedure well without any complications. See procedural note below for details. Postoperatively, the patient did very well. he  was able to pass PT protocols on post-op day one without any issues. JP drain was removed without any difficulty and was intact. he  was able to void his bladder without any difficulty. Physical exam was unremarkable. he  denies any SOB, CP, N/V, fevers or chills. Vital signs are stable. Patient is stable to discharge home.  PROCEDURE:  Left total knee arthroplasty using computer-assisted navigation   SURGEON:  Jena Gauss. M.D.   ASSISTANT:  Gean Birchwood, PA-C (present and scrubbed throughout the case, critical for assistance with exposure, retraction, instrumentation, and closure)   ANESTHESIA: spinal   ESTIMATED BLOOD LOSS: 50 mL   FLUIDS REPLACED: 1200 mL of crystalloid   TOURNIQUET TIME: 103 minutes   DRAINS: 2 medium Hemovac drains   SOFT TISSUE RELEASES: Anterior cruciate ligament, posterior cruciate ligament, deep medial collateral ligament, patellofemoral  ligament   IMPLANTS UTILIZED: DePuy Attune size 7 posterior stabilized femoral component (cemented), size 8 rotating platform tibial component (cemented), 41 mm medialized dome patella (cemented), and a 6 mm stabilized rotating platform polyethylene insert.   Treatments: None  Discharge Exam: Blood pressure (!) 140/71, pulse 73, temperature 97.9 F (36.6 C), resp. rate 16, height 6\' 2"  (1.88 m), weight 89.4 kg, SpO2 96%.  Disposition: Home   Allergies as of 06/09/2023       Reactions   Codeine Rash        Medication List     STOP taking these medications    ibuprofen 200 MG tablet Commonly known as: ADVIL       TAKE these medications    amLODipine 5 MG tablet Commonly known as: NORVASC Take 5 mg by mouth at bedtime.   aspirin EC 81 MG tablet Take 1 tablet (81 mg total) by mouth 2 (two) times daily. What changed: when to take this   celecoxib 200 MG capsule Commonly known as: CELEBREX Take 1 capsule (200 mg total) by mouth 2 (two) times daily.   chlorhexidine 4 % external liquid Commonly known as: HIBICLENS Apply 15 mLs (1 Application total) topically as directed for 30 doses. Use as directed daily for 5 days every other week for 6 weeks.   cholecalciferol 25 MCG (1000 UNIT) tablet Commonly known as: VITAMIN D3 Take 1,000 Units by mouth daily.   cyanocobalamin 1000 MCG tablet Commonly known as: VITAMIN B12 Take 1,000 mcg by mouth daily.   finasteride 5 MG tablet Commonly known as: PROSCAR Take 5 mg by mouth at bedtime.   Jardiance 25 MG Tabs tablet Generic drug: empagliflozin Take  25 mg by mouth daily.   metFORMIN 500 MG 24 hr tablet Commonly known as: GLUCOPHAGE-XR Take 500 mg by mouth 2 (two) times daily.   mupirocin ointment 2 % Commonly known as: BACTROBAN Place 1 Application into the nose 2 (two) times daily for 60 doses. Use as directed 2 times daily for 5 days every other week for 6 weeks.   oxyCODONE 5 MG immediate release  tablet Commonly known as: Oxy IR/ROXICODONE Take 1 tablet (5 mg total) by mouth every 4 (four) hours as needed for moderate pain (pain score 4-6) (pain score 4-6).   pantoprazole 40 MG tablet Commonly known as: PROTONIX Take 40 mg by mouth as needed.   pravastatin 20 MG tablet Commonly known as: PRAVACHOL Take 20 mg by mouth every morning.   traMADol 50 MG tablet Commonly known as: ULTRAM Take 1-2 tablets (50-100 mg total) by mouth every 4 (four) hours as needed for moderate pain (pain score 4-6).   VITAMIN C ER PO Take 1,000 mg by mouth daily.               Durable Medical Equipment  (From admission, onward)           Start     Ordered   06/08/23 1707  DME Walker rolling  Once       Question:  Patient needs a walker to treat with the following condition  Answer:  Total knee replacement status   06/08/23 1706   06/08/23 1707  DME Bedside commode  Once       Comments: Patient is not able to walk the distance required to go the bathroom, or he/she is unable to safely negotiate stairs required to access the bathroom.  A 3in1 BSC will alleviate this problem  Question:  Patient needs a bedside commode to treat with the following condition  Answer:  Total knee replacement status   06/08/23 1706            Follow-up Information     Rayburn Go, PA-C Follow up on 06/24/2023.   Specialty: Orthopedic Surgery Why: at 9:15am Contact information: [address] [place] [address] [phone]         Donato Heinz, MD Follow up on 07/26/2023.   Specialty: Orthopedic Surgery Why: at 3:00pm Contact information: [address] [facility] [place] [address] [phone]                 Signed: Gean Birchwood 06/09/2023, 9:48 AM   Objective: Vital signs in last 24 hours: Temp:  [97.6 F (36.4 C)-98.1 F (36.7 C)] 97.9 F (36.6 C) (04/10 0800) Pulse Rate:  [69-93] 73 (04/10 0732) Resp:  [10-21] 16 (04/10 0732) BP:  (94-156)/(58-84) 140/71 (04/10 0732) SpO2:  [94 %-100 %] 96 % (04/10 0732)  Intake/Output from previous day:  Intake/Output Summary (Last 24 hours) at 06/09/2023 0948 Last data filed at 06/09/2023 0737 Gross per 24 hour  Intake 1968.32 ml  Output 620 ml  Net 1348.32 ml    Intake/Output this shift: Total I/O In: -  Out: 100 [Drains:100]  Labs: No results for input(s): "HGB" in the last 72 hours. No results for input(s): "WBC", "RBC", "HCT", "PLT" in the last 72 hours. No results for input(s): "NA", "K", "CL", "CO2", "BUN", "CREATININE", "GLUCOSE", "CALCIUM" in the last 72 hours. No results for input(s): "LABPT", "INR" in the last 72 hours.  EXAM: General - Patient is Alert, Appropriate, and Oriented Extremity - Neurologically intact Neurovascular  intact Sensation intact distally Intact pulses distally Dorsiflexion/Plantar flexion intact No cellulitis present Compartment soft Dressing - dressing C/D/I and no drainage Motor Function - intact, moving foot and toes well on exam. JP Drain pulled without difficulty. Intact   Assessment/Plan: 1 Day Post-Op Procedure(s) (LRB): ARTHROPLASTY, KNEE, TOTAL, USING IMAGELESS COMPUTER-ASSISTED NAVIGATION (Left) Procedure(s) (LRB): ARTHROPLASTY, KNEE, TOTAL, USING IMAGELESS COMPUTER-ASSISTED NAVIGATION (Left) Past Medical History:  Diagnosis Date   AAA (abdominal aortic aneurysm) (HCC)    Adrenal adenoma, left    Aortic atherosclerosis (HCC)    Arrhythmia    DM (diabetes mellitus), type 2 (HCC)    Emphysema of lung (HCC)    Former cigarette smoker    GERD (gastroesophageal reflux disease)    Hepatic steatosis    Hypertension    Lung nodule    Osteoarthritis of both knees    Principal Problem:   History of total knee arthroplasty, left  Estimated body mass index is 25.31 kg/m as calculated from the following:   Height as of this encounter: 6\' 2"  (1.88 m).   Weight as of this encounter: 89.4 kg.  Patient will continue to  work with physical therapy to pass postoperative PT protocols, ROM and strengthening   Discussed with the patient continuing to utilize [employer]   Patient will use bone foam in 20-30 minute intervals   Patient will wear TED hose bilaterally to help prevent DVT and clot formation   Discussed the Aquacel bandage.  This bandage will stay in place 7 days postoperatively.  Can be replaced with honeycomb bandages that will be sent home with the patient   Discussed sending the patient home with tramadol and oxycodone for as needed pain management.  Patient will also be sent home with Celebrex to help with swelling and inflammation.  Patient will take an 81 mg aspirin twice daily for DVT prophylaxis   JP drain removed without difficulty, intact   Weight-Bearing as tolerated to left leg   Patient will follow-up with [facility] orthopedics in 2 weeks for staple removal and reevaluation  Diet - Regular diet Follow up - in 2 weeks Activity - WBAT Disposition - Home Condition Upon Discharge - Stable DVT Prophylaxis - Aspirin and TED hose  Danise Edge, PA-C Orthopaedic Surgery 06/09/2023, 9:48 AM

## 2023-06-09 NOTE — Plan of Care (Signed)
  Problem: Safety: Goal: Ability to remain free from injury will improve Outcome: Progressing   Problem: Activity: Goal: Ability to avoid complications of mobility impairment will improve Outcome: Progressing   Problem: Pain Management: Goal: Pain level will decrease with appropriate interventions Outcome: Progressing

## 2023-06-10 NOTE — Anesthesia Postprocedure Evaluation (Signed)
 Anesthesia Post Note  Patient: JAYDEE CONRAN  Procedure(s) Performed: ARTHROPLASTY, KNEE, TOTAL, USING IMAGELESS COMPUTER-ASSISTED NAVIGATION (Left: Knee)  Patient location during evaluation: Nursing Unit Anesthesia Type: Spinal Level of consciousness: awake and alert Pain management: pain level controlled Vital Signs Assessment: post-procedure vital signs reviewed and stable Respiratory status: spontaneous breathing, nonlabored ventilation, respiratory function stable and patient connected to nasal cannula oxygen Cardiovascular status: blood pressure returned to baseline and stable Postop Assessment: no apparent nausea or vomiting Anesthetic complications: no  No notable events documented.   Last Vitals:  Vitals:   06/09/23 0800 06/09/23 1034  BP:  135/68  Pulse:  88  Resp:  18  Temp: 36.6 C 36.7 C  SpO2:  94%    Last Pain:  Vitals:   06/09/23 1034  TempSrc: Oral  PainSc: 0-No pain                 Stephanie Coup

## 2023-07-28 ENCOUNTER — Ambulatory Visit
Admission: RE | Admit: 2023-07-28 | Discharge: 2023-07-28 | Disposition: A | Discharge status: Home / Self Care | Source: Ambulatory Visit | Attending: Internal Medicine | Admitting: Internal Medicine

## 2023-07-28 ENCOUNTER — Other Ambulatory Visit: Payer: Self-pay | Admitting: Internal Medicine

## 2023-07-28 ENCOUNTER — Other Ambulatory Visit
Admission: RE | Admit: 2023-07-28 | Discharge: 2023-07-28 | Disposition: A | Discharge status: Home / Self Care | Source: Ambulatory Visit | Attending: Internal Medicine | Admitting: Internal Medicine

## 2023-07-28 DIAGNOSIS — R0789 Other chest pain: Secondary | ICD-10-CM | POA: Diagnosis present

## 2023-07-28 DIAGNOSIS — R0602 Shortness of breath: Secondary | ICD-10-CM | POA: Insufficient documentation

## 2023-07-28 DIAGNOSIS — R079 Chest pain, unspecified: Secondary | ICD-10-CM | POA: Insufficient documentation

## 2023-07-28 LAB — D-DIMER, QUANTITATIVE: D-Dimer, Quant: 2 ug{FEU}/mL — ABNORMAL HIGH (ref 0.00–0.50)

## 2023-07-28 MED ORDER — IOHEXOL 350 MG/ML SOLN
75.0000 mL | Freq: Once | INTRAVENOUS | Status: AC | PRN
  Administered 2023-07-28: 75 mL via INTRAVENOUS

## 2023-07-29 ENCOUNTER — Encounter: Payer: Self-pay | Admitting: Internal Medicine

## 2023-07-29 DIAGNOSIS — R0789 Other chest pain: Secondary | ICD-10-CM
